# Patient Record
Sex: Male | Born: 1975 | Race: White | Hispanic: No | Marital: Married | Smoking: Never smoker
Health system: Southern US, Community
[De-identification: ages and names within clinical notes are randomized; demographics above are authoritative.]

## PROBLEM LIST (undated history)

## (undated) DIAGNOSIS — N200 Calculus of kidney: Secondary | ICD-10-CM

## (undated) DIAGNOSIS — Y249XXA Unspecified firearm discharge, undetermined intent, initial encounter: Secondary | ICD-10-CM

## (undated) DIAGNOSIS — W3400XA Accidental discharge from unspecified firearms or gun, initial encounter: Secondary | ICD-10-CM

## (undated) DIAGNOSIS — I10 Essential (primary) hypertension: Secondary | ICD-10-CM

## (undated) DIAGNOSIS — E213 Hyperparathyroidism, unspecified: Secondary | ICD-10-CM

## (undated) DIAGNOSIS — F419 Anxiety disorder, unspecified: Secondary | ICD-10-CM

## (undated) HISTORY — DX: Anxiety disorder, unspecified: F41.9

## (undated) HISTORY — DX: Hyperparathyroidism, unspecified: E21.3

## (undated) HISTORY — DX: Accidental discharge from unspecified firearms or gun, initial encounter: W34.00XA

## (undated) HISTORY — DX: Calculus of kidney: N20.0

## (undated) HISTORY — PX: OTHER SURGICAL HISTORY: SHX169

## (undated) HISTORY — DX: Unspecified firearm discharge, undetermined intent, initial encounter: Y24.9XXA

---

## 1999-08-24 ENCOUNTER — Emergency Department (HOSPITAL_COMMUNITY): Admission: EM | Admit: 1999-08-24 | Discharge: 1999-08-24 | Payer: Self-pay | Admitting: *Deleted

## 2001-02-01 ENCOUNTER — Encounter: Payer: Self-pay | Admitting: Urology

## 2001-02-01 ENCOUNTER — Encounter: Admission: RE | Admit: 2001-02-01 | Discharge: 2001-02-01 | Payer: Self-pay | Admitting: Urology

## 2001-05-11 HISTORY — PX: PARATHYROIDECTOMY: SHX19

## 2002-03-09 ENCOUNTER — Ambulatory Visit (HOSPITAL_COMMUNITY): Admission: RE | Admit: 2002-03-09 | Discharge: 2002-03-09 | Payer: Self-pay | Admitting: Surgery

## 2002-03-09 ENCOUNTER — Encounter (INDEPENDENT_AMBULATORY_CARE_PROVIDER_SITE_OTHER): Payer: Self-pay | Admitting: Specialist

## 2008-04-06 ENCOUNTER — Emergency Department (HOSPITAL_BASED_OUTPATIENT_CLINIC_OR_DEPARTMENT_OTHER): Admission: EM | Admit: 2008-04-06 | Discharge: 2008-04-06 | Payer: Self-pay | Admitting: Emergency Medicine

## 2008-07-11 ENCOUNTER — Emergency Department (HOSPITAL_COMMUNITY): Admission: EM | Admit: 2008-07-11 | Discharge: 2008-07-11 | Payer: Self-pay | Admitting: Emergency Medicine

## 2010-05-11 HISTORY — PX: KNEE ARTHROSCOPY: SHX127

## 2010-09-26 NOTE — Op Note (Signed)
Derek Holloway, Derek Holloway                        ACCOUNT NO.:  [id number]   MEDICAL RECORD NO.:  [id number]                   PATIENT TYPE:  OIB   LOCATION:  2891                                 FACILITY:  [facility]   PHYSICIAN:  Abigail Miyamoto, MD                DATE OF BIRTH:  1976/03/09   DATE OF PROCEDURE:  03/09/2002  DATE OF DISCHARGE:                                 OPERATIVE REPORT   PREOPERATIVE DIAGNOSES:  Right inferior parathyroid adenoma.   POSTOPERATIVE DIAGNOSES:  Right inferior parathyroid adenoma.   OPERATION PERFORMED:  Minimally invasive parathyroid resection (MIRP).   SURGEON:  Douglas A. Magnus Ivan, M.D.   ASSISTANT:  Velora Heckler, M.D.   ANESTHESIA:  General endotracheal.   INDICATIONS FOR PROCEDURE:  The patient is a 35 year old gentleman who was  found to have persistently elevated calcium greater than 11.  He also has a  history of kidney stones multiple times.  A sestamibi scan showed findings  consistent with a right inferior parathyroid adenoma.   OPERATIVE FINDINGS:  The patient was found indeed to have a right inferior  parathyroid adenoma on frozen section.   DESCRIPTION OF PROCEDURE:  The patient was brought to the operating room and  identified.  He was placed supine on the operating table and general  anesthesia was induced.  His neck was then prepped and draped in the usual  sterile fashion.  Using a Neoprobe, an area on the right lower neck was  identified.  A small transverse incision was then made over the hot spot.  Incision was carried down to the platysma with the electrocautery.  A  bridging vein was identified and tied off with 2-0 silk ties.  The midline  was then identified and opened and the strap muscles were retracted  laterally.  The thyroid gland was identified and dissection was carried out  bluntly with Kitners elevating the lower pole of the thyroid gland.  Using a  Neoprobe, dissection was carried out further in the neck and  an area of  increased uptake was identified.  Further dissection revealed an enlarged  right inferior parathyroid gland.  Dissection was then carried out  circumferentially both bluntly and with the use of the cautery as well as  small surgical clips.  These vessels and vascular pedicle to the parathyroid  gland were identified and clipped.  What appeared to be the parathyroid  gland was excised from the neck and sent to pathology.  Frozen section found  this to be a large parathyroid gland consistent with an adenoma.  At this  point the neck was thoroughly irrigated with normal saline.  Hemostasis  appeared to be achieved.  A small piece of Surgicel was placed in the small  dissection area.  The strap muscles were then closed in the midline with  interrupted 4-0 Vicryl sutures.  The platysma was then reapproximated with  interrupted 4-0 Vicryl sutures  and the skin was closed with running 4-0  Monocryl.  Steri-  Strips, gauze and tape were then applied.  The patient tolerated the  procedure well.  All sponge, needle and instrument counts were correct at  the end of the procedure.  The patient was then extubated in the operating  room and taken in stable condition to the recovery room.                                                  Abigail Miyamoto, MD    DB/MEDQ  D:  03/09/2002  T:  03/09/2002  Job:  [id number]

## 2010-12-22 ENCOUNTER — Ambulatory Visit (HOSPITAL_BASED_OUTPATIENT_CLINIC_OR_DEPARTMENT_OTHER)
Admission: RE | Admit: 2010-12-22 | Discharge: 2010-12-22 | Disposition: A | Discharge status: Home / Self Care | Payer: 59 | Source: Ambulatory Visit | Attending: Specialist | Admitting: Specialist

## 2010-12-22 DIAGNOSIS — M2240 Chondromalacia patellae, unspecified knee: Secondary | ICD-10-CM | POA: Insufficient documentation

## 2010-12-22 DIAGNOSIS — IMO0002 Reserved for concepts with insufficient information to code with codable children: Secondary | ICD-10-CM | POA: Insufficient documentation

## 2010-12-22 DIAGNOSIS — X58XXXA Exposure to other specified factors, initial encounter: Secondary | ICD-10-CM | POA: Insufficient documentation

## 2010-12-23 LAB — POCT HEMOGLOBIN-HEMACUE: Hemoglobin: 16.2 g/dL (ref 13.0–17.0)

## 2010-12-23 NOTE — Op Note (Signed)
NAMEMarland Kitchen  Derek, Holloway            ACCOUNT NO.:  [id number]  MEDICAL RECORD NO.:  [id number]  LOCATION:                                 FACILITY:  PHYSICIAN:  Jene Every, M.D.    DATE OF BIRTH:  1975-08-03  DATE OF PROCEDURE:  12/22/2010 DATE OF DISCHARGE:                              OPERATIVE REPORT   PREOPERATIVE DIAGNOSES:  Meniscus tear of left knee, degenerative joint disease.  POSTOPERATIVE DIAGNOSES: 1. Grade 3 chondromalacia of the medial femoral condyle, 25 mm x 12     mm. 2. 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  PROCEDURE PERFORMED: 1. Left knee arthroscopy. 2. Shaving a medial meniscus. 3. Chondroplasty of the medial femoral condyle, lateral tibial     plateau, patella with excision of chondral flap tears.  ASSISTANT:  None.  BRIEF HISTORY:  This is a 35 year old gentleman who has a history of chronic knee pain, locking, giving way, and swelling, refractory to conservative treatment including corticosteroid injections and activity modification.  He had an MRI, which indicated meniscal pathology, chondral pathology, lateral sloping patella, history of a knee dislocation.  He had no patellofemoral symptoms, however, he was indicated for arthroscopic evaluation, debridement, and removal of loose bodies.  Risks and benefits discussed including bleeding, infection, no change in symptoms, worsening symptoms, need for repeat debridement, DVT, PE, anesthetic complications, etc.  TECHNIQUE:  The patient in supine position after induction of adequate anesthesia, 2 g Kefzol, left lower extremity was prepped and draped in the usual sterile fashion.  A lateral parapatellar portal was fashioned with a #11 blade.  Ingress cannula was atraumatically placed.  Irrigant was utilized to insufflate the joint.  Under direct visualization, medial parapatellar  portal was fashioned with a #11 blade after localization with an 18 gauge needle sparing the medial meniscus. Noted, immediately was extensive grade 3 chondral injury to the femoral condyle.  Some loose cartilaginous debris was noted.  It was not a grade 4 lesion.  I introduced a basket and then trimmed the edges to a stable base.  Loose cartilaginous debris was evacuated, used a 3.5 [place] shaver for that.  Radial tear in the posterior third of the medial meniscus was debrided to a stable base.  The tibial plateau showed some minor tibial softening.  ACL was unremarkable.  Lateral compartment revealed a deep chondral fissure and lateral tibial plateau, measuring 50 mm x 3 mm.  I trimmed the edges of that to stable base.  He, however, had cartilage at its base was not grade 4 lesion. It was otherwise stable, the overlying femoral condyle as was unremarkable.  The meniscus was stable to probe palpation without evidence of tearing.  Evacuate loose bodies from the lateral compartment 3.5 [place] shaver.  Suprapatellar pouch revealed some lateral tracking of the patella, but at the lateral margin there was no erosion of the patella and the femoral condyle.  In the sulcus, however, there were extensive grade 3 changes of the sulcus.  We excised the edges of the chondral flap tearing to  a stable base, evacuated with 3.5 [place] shaver, light chondroplasty of the patella just of its superomedial aspect.  Knee was copiously lavaged.  Gutters were unremarkable.  Revisited all 3 compartments.  No further pathology amenable to arthroscopic intervention.  I therefore removed all instrumentation.  Portals were closed with 4-0 nylon simple sutures.  0.25% percent Marcaine with epinephrine  was infiltrated in the joint.  The wound was dressed sterilely.  Awoken without difficulty and transported to the recovery room in satisfactory condition.  The patient tolerated the procedure well with no  complications.     Jene Every, M.D.     Derek Holloway  D:  12/22/2010  T:  12/23/2010  Job:  [id number]  Electronically Signed by Jene Every M.D. on 12/23/2010 01:24:48 PM

## 2011-02-10 LAB — BASIC METABOLIC PANEL
BUN: 17
CO2: 27
Chloride: 101
Creatinine, Ser: 1.5
GFR calc Af Amer: >60
Potassium: 3.5

## 2011-02-10 LAB — DIFFERENTIAL
Basophils Relative: 2 — ABNORMAL HIGH
Eosinophils Absolute: 0
Eosinophils Relative: 0
Lymphs Abs: 1.1
Monocytes Absolute: 2 — ABNORMAL HIGH

## 2011-02-10 LAB — CBC
HCT: 47.5
MCHC: 33.4
MCV: 78.7
RBC: 6.04 — ABNORMAL HIGH
WBC: 18.4 — ABNORMAL HIGH

## 2012-12-02 ENCOUNTER — Emergency Department (HOSPITAL_BASED_OUTPATIENT_CLINIC_OR_DEPARTMENT_OTHER)
Admission: EM | Admit: 2012-12-02 | Discharge: 2012-12-02 | Disposition: A | Discharge status: Home / Self Care | Payer: 59 | Attending: Emergency Medicine | Admitting: Emergency Medicine

## 2012-12-02 ENCOUNTER — Encounter (HOSPITAL_BASED_OUTPATIENT_CLINIC_OR_DEPARTMENT_OTHER): Payer: Self-pay | Admitting: *Deleted

## 2012-12-02 DIAGNOSIS — R55 Syncope and collapse: Secondary | ICD-10-CM | POA: Insufficient documentation

## 2012-12-02 DIAGNOSIS — Z8639 Personal history of other endocrine, nutritional and metabolic disease: Secondary | ICD-10-CM | POA: Insufficient documentation

## 2012-12-02 DIAGNOSIS — I10 Essential (primary) hypertension: Secondary | ICD-10-CM | POA: Insufficient documentation

## 2012-12-02 DIAGNOSIS — Z862 Personal history of diseases of the blood and blood-forming organs and certain disorders involving the immune mechanism: Secondary | ICD-10-CM | POA: Insufficient documentation

## 2012-12-02 HISTORY — DX: Essential (primary) hypertension: I10

## 2012-12-02 LAB — CBC WITH DIFFERENTIAL/PLATELET
Basophils Absolute: 0 10*3/uL (ref 0.0–0.1)
Basophils Relative: 1 % (ref 0–1)
Eosinophils Absolute: 0.4 10*3/uL (ref 0.0–0.7)
Eosinophils Relative: 5 % (ref 0–5)
HCT: 42.6 % (ref 39.0–52.0)
Lymphocytes Relative: 26 % (ref 12–46)
MCH: 27.1 pg (ref 26.0–34.0)
MCHC: 34.3 g/dL (ref 30.0–36.0)
MCV: 79 fL (ref 78.0–100.0)
Monocytes Absolute: 0.7 10*3/uL (ref 0.1–1.0)
RDW: 14.1 % (ref 11.5–15.5)

## 2012-12-02 LAB — BASIC METABOLIC PANEL
BUN: 14 mg/dL (ref 6–23)
CO2: 27 mEq/L (ref 19–32)
Calcium: 9.9 mg/dL (ref 8.4–10.5)
Chloride: 99 mEq/L (ref 96–112)
Creatinine, Ser: 1.2 mg/dL (ref 0.50–1.35)
Glucose, Bld: 96 mg/dL (ref 70–99)

## 2012-12-02 NOTE — ED Provider Notes (Signed)
  CSN: [id number]     Arrival date & time 12/02/12  1812 History     First MD Initiated Contact with Patient 12/02/12 2010     Chief Complaint  Patient presents with  . Dizziness   (Consider location/radiation/quality/duration/timing/severity/associated sxs/prior Treatment) HPI This 37 year old male had several minutes of lightheadedness presyncope onset while sitting in a theater tonight, he is able to get up walk out get some water and walk to his car and feel better, was not related to position change, and he has had recurrent symptoms similar to this about once every several weeks over the last few years but never saw Dr. for it, he is no syncope no trauma no headache no change in speech or vision no focal or lateralizing weakness or numbness no chest pain no palpitations no abdominal pain no nausea no sweats no recent bloody stools and no treatment prior to arrival.He is currently symptomatic.he does not have any history of valvular heart disease, coronary artery disease, or heart failure. No history of exertional presyncope/syncope. Past Medical History  Diagnosis Date  . Hypertension   . Thyroid disease    Past Surgical History  Procedure Laterality Date  . Joint replacement     History reviewed. No pertinent family history. History  Substance Use Topics  . Smoking status: Never Smoker   . Smokeless tobacco: Not on file  . Alcohol Use: No    Review of Systems 10 Systems reviewed and are negative for acute change except as noted in the HPI. Allergies  Review of patient's allergies indicates no known allergies.  Home Medications  No current outpatient prescriptions on file. BP 136/86  Pulse 64  Temp(Src) 98.3 F (36.8 C) (Oral)  Resp 14  Ht 5\' 8"  (1.727 m)  Wt 210 lb (95.255 kg)  BMI 31.94 kg/m2  SpO2 99% Physical Exam  Nursing note and vitals reviewed. Constitutional:  Awake, alert, nontoxic appearance.  HENT:  Head: Atraumatic.  Eyes: Right eye exhibits no  discharge. Left eye exhibits no discharge.  Neck: Neck supple.  Cardiovascular: Normal rate and regular rhythm.   No murmur heard. Pulmonary/Chest: Effort normal and breath sounds normal. No respiratory distress. He has no wheezes. He has no rales. He exhibits no tenderness.  Abdominal: Soft. Bowel sounds are normal. He exhibits no distension. There is no tenderness. There is no rebound.  Musculoskeletal: He exhibits no edema and no tenderness.  Baseline ROM, no obvious new focal weakness.  Neurological: He is alert.  Mental status and motor strength appears baseline for patient and situation.  Skin: No rash noted.  Psychiatric: He has a normal mood and affect.    ED Course  ECG: Normal sinus rhythm, ventricular rate 93, normal axis, normal pulse, no acute ischemic changes noted, impression normal ECG, no comparison ECG immediately available  Procedures (including critical care time) Patient / Family / Caregiver informed of clinical course, understand medical decision-making process, and agree with plan. Labs Reviewed  BASIC METABOLIC PANEL - Abnormal; Notable for the following:    Potassium 3.4 (*)    GFR calc non Af Amer 75 (*)    GFR calc Af Amer 87 (*)    All other components within normal limits  GLUCOSE, CAPILLARY  CBC WITH DIFFERENTIAL  TSH   No results found. 1. Near syncope     MDM  I doubt any other EMC precluding discharge at this time including, but not necessarily limited to the following:Vtach.  Hurman Horn, MD 12/03/12 1235

## 2012-12-02 NOTE — ED Notes (Signed)
MD at bedside. 

## 2012-12-02 NOTE — ED Notes (Signed)
Pt c/o sudden onset of dizziness and lightheaded  Lasting 1 minx 20 mins ago

## 2012-12-26 ENCOUNTER — Ambulatory Visit: Payer: 59 | Admitting: Internal Medicine

## 2012-12-30 ENCOUNTER — Encounter: Payer: Self-pay | Admitting: Internal Medicine

## 2012-12-30 ENCOUNTER — Ambulatory Visit (INDEPENDENT_AMBULATORY_CARE_PROVIDER_SITE_OTHER): Payer: 59 | Admitting: Internal Medicine

## 2012-12-30 VITALS — BP 145/100 | HR 96 | Temp 98.1°F | Ht 69.5 in | Wt 218.0 lb

## 2012-12-30 DIAGNOSIS — I10 Essential (primary) hypertension: Secondary | ICD-10-CM

## 2012-12-30 DIAGNOSIS — R42 Dizziness and giddiness: Secondary | ICD-10-CM | POA: Insufficient documentation

## 2012-12-30 DIAGNOSIS — E213 Hyperparathyroidism, unspecified: Secondary | ICD-10-CM

## 2012-12-30 DIAGNOSIS — J309 Allergic rhinitis, unspecified: Secondary | ICD-10-CM

## 2012-12-30 LAB — LIPID PANEL
Cholesterol: 223 mg/dL — ABNORMAL HIGH (ref 0–200)
Total CHOL/HDL Ratio: 5

## 2012-12-30 LAB — LDL CHOLESTEROL, DIRECT: Direct LDL: 163.6 mg/dL

## 2012-12-30 LAB — BASIC METABOLIC PANEL
BUN: 17 mg/dL (ref 6–23)
Calcium: 9.7 mg/dL (ref 8.4–10.5)
Chloride: 105 mEq/L (ref 96–112)
Creatinine, Ser: 1.3 mg/dL (ref 0.4–1.5)

## 2012-12-30 MED ORDER — AMLODIPINE BESYLATE 5 MG PO TABS: 5.0000 mg | ORAL_TABLET | Freq: Every day | ORAL | Status: DC

## 2012-12-30 MED ORDER — FLUTICASONE PROPIONATE 50 MCG/ACT NA SUSP: 2.0000 | Freq: Every day | NASAL | Status: DC

## 2012-12-30 NOTE — Assessment & Plan Note (Addendum)
Mildly elevated BP from baseline of 116/60 in the setting of weight gain. Slightly elevated BP could be from a change in lifestyle or the beginning of primary hypertension. Plan: Recheck a BMP, screening for high cholesterol Diet, exercise discussed, the patient states he will improve his lifestyle. Start amlodipine, watch for edema, see instructions

## 2012-12-30 NOTE — Progress Notes (Signed)
  Subjective:    Patient ID: Derek Holloway, male    DOB: May 21, 1975, 37 y.o.   MRN: [id number]  HPI Patient, We discussed the following issues. Elevated BP, baseline  BP for years was 116/60, for the last one half years his BP has been 130/86. Today it is 145/100. Reports 20 pound weight gain in the last 2 years, not exercising as much as he did before. Allergies-denies several years history of allergies, has taken different antihistaminics on and off, he used to have a nasal sprays but worked well. Likes to be tested further by an allergist "Dizziness"-- Few months ago was at the movies and all of a sudden felt really weird, "out of my head, out of my body", "like i just wake up " Symptoms lasted 5 seconds, subsequently he went to the ER, labs and EKG reviewed, they were essentially normal.  Past Medical History  Diagnosis Date  . Hypertension   . Hyperparathyroidism, unspecified     s/p surgery  . Kidney stones    Past Surgical History  Procedure Laterality Date  . Knee arthroscopy Left 2012  . Parathyroidectomy  2003   History   Social History  . Marital Status: Married    Spouse Name: N/A    Number of Children: 2  . Years of Education: N/A   Occupational History  . Police Officer [employer]   Social History Main Topics  . Smoking status: Never Smoker   . Smokeless tobacco: Never Used  . Alcohol Use: Yes     Comment: rare  . Drug Use: No  . Sexual Activity: No   Other Topics Concern  . Not on file   Social History Narrative  . No narrative on file   Family History  Problem Relation Age of Onset  . CAD      GM in her 33s  . Stroke Neg Hx   . Hypertension Mother   . Diabetes Neg Hx   . Colon cancer Neg Hx   . Breast cancer Neg Hx      Review of Systems And the episodes as described above he did not experience actual vertigo, slurred speech, mental status changes, focal deficit, face numbness, or diplopia. No chest pain or palpitations No  HAs No seizure activities.     Objective:   Physical Exam BP 145/100  Pulse 96  Temp(Src) 98.1 F (36.7 C)  Ht 5' 9.5" (1.765 m)  Wt 218 lb (98.884 kg)  BMI 31.74 kg/m2  SpO2 98% General -- alert, well-developed, NAD.  Neck --no thyromegaly  HEENT-- Not pale.  Lungs -- normal respiratory effort, no intercostal retractions, no accessory muscle use, and normal breath sounds.  Heart-- normal rate, regular rhythm, no murmur.  Abdomen-- Not distended, good bowel sounds,soft, non-tender. No bruit   Extremities-- no pretibial edema bilaterally  Neurologic-- alert & oriented X3. Speech, gait normal. strength normal in all extremities.  Psych-- Cognition and judgment appear intact. Alert and cooperative with normal attention span and concentration. not anxious appearing and not depressed appearing.      Assessment & Plan:  Today , I spent more than 30 min with the patient, >50% of the time counseling, and /or reviewing the chart and labs ordered by other providers

## 2012-12-30 NOTE — Assessment & Plan Note (Addendum)
Several year history of allergies, continue with OTC antihistaminics, add Flonase and refer to an allergist as patient likes further testing

## 2012-12-30 NOTE — Assessment & Plan Note (Signed)
Spells as described above unlikely to be cardiac, absent seizure? Recommend observation. Consider neurology eval if sx continue

## 2012-12-30 NOTE — Patient Instructions (Addendum)
Get your blood work before you leave  Next visit in 4 months for a physical exam   Please make an appointment before you leave the office today (or call few weeks in advance) --- Check the  blood pressure 2 or 3 times a week, be sure it is between 110/60 and 135/80. If it is consistently higher or lower, let me know ---   Sodium-Controlled Diet Sodium is a mineral. It is found in many foods. Sodium may be found naturally or added during the making of a food. The most common form of sodium is salt, which is made up of sodium and chloride. Reducing your sodium intake involves changing your eating habits. The following guidelines will help you reduce the sodium in your diet:  Stop using the salt shaker.  Use salt sparingly in cooking and baking.  Substitute with sodium-free seasonings and spices.  Do not use a salt substitute (potassium chloride) without your caregiver's permission.  Include a variety of fresh, unprocessed foods in your diet.  Limit the use of processed and convenience foods that are high in sodium. USE THE FOLLOWING FOODS SPARINGLY: Breads/Starches  Commercial bread stuffing, commercial pancake or waffle mixes, coating mixes. Waffles. Croutons. Prepared (boxed or frozen) potato, rice, or noodle mixes that contain salt or sodium. Salted [place] fries or hash browns. Salted popcorn, breads, crackers, chips, or snack foods. Vegetables  Vegetables canned with salt or prepared in cream, butter, or cheese sauces. Sauerkraut. Tomato or vegetable juices canned with salt.  Fresh vegetables are allowed if rinsed thoroughly. Fruit  Fruit is okay to eat. Meat and Meat Substitutes  Salted or smoked meats, such as bacon or Canadian bacon, chipped or corned beef, hot dogs, salt pork, luncheon meats, pastrami, ham, or sausage. Canned or smoked fish, poultry, or meat. Processed cheese or cheese spreads, blue or Roquef[place] cheese. Battered or frozen fish products. Prepared spaghetti  sauce. Baked beans. Reuben sandwiches. Salted nuts. Caviar. Milk  Limit buttermilk to 1 cup per week. Soups and Combination Foods  Bouillon cubes, canned or dried soups, broth, consomm. Convenience (frozen or packaged) dinners with more than 600 mg sodium. Pot pies, pizza, Asian food, fast food cheeseburgers, and specialty sandwiches. Desserts and Sweets  Regular (salted) desserts, pie, commercial fruit snack pies, commercial snack cakes, canned puddings.  Eat desserts and sweets in moderation. Fats and Oils  Gravy mixes or canned gravy. No more than 1 to 2 tbs of salad dressing. Chip dips.  Eat fats and oils in moderation. Beverages  See those listed under the vegetables and milk groups. Condiments  Ketchup, mustard, meat sauces, salsa, regular (salted) and lite soy sauce or mustard. Dill pickles, olives, meat tenderizer. Prepared horseradish or pickle relish. Dutch-processed cocoa. Baking powder or baking soda used medicinally. Worcestershire sauce. "Light" salt. Salt substitute, unless approved by your caregiver. Document Released: 10/17/2001 Document Revised: 07/20/2011 Document Reviewed: 05/20/2009 [facility] Patient Information 2014 [place], [place].

## 2013-01-01 ENCOUNTER — Encounter: Payer: Self-pay | Admitting: Internal Medicine

## 2013-01-01 NOTE — Assessment & Plan Note (Signed)
Labs

## 2013-03-16 ENCOUNTER — Other Ambulatory Visit: Payer: Self-pay

## 2013-04-17 ENCOUNTER — Encounter: Payer: 59 | Admitting: Internal Medicine

## 2013-08-28 ENCOUNTER — Other Ambulatory Visit: Payer: Self-pay | Admitting: Internal Medicine

## 2013-10-30 ENCOUNTER — Telehealth: Payer: Self-pay | Admitting: *Deleted

## 2013-10-30 NOTE — Telephone Encounter (Signed)
Patient called to return Sandra's phone call. Patient states that you can call his cell phone to get in touch with him. Cell number:[phone]

## 2013-10-30 NOTE — Telephone Encounter (Signed)
Left message with pts wife for the patient to return my call regarding upcoming appt tomorrow.

## 2013-10-30 NOTE — Telephone Encounter (Signed)
Medication List and allergies:  Reviewed and updated   Local prescriptions: [facility] in [place]  Immunizations Due: Pna/Td  A/P FH, PSH, or Personal Hx: Reviewed and updated Flu vaccine: 03/2013 Tdap: 04/1993 PNA: Never   To Discuss with Provider: Seasonal allergies are no longer controlled with Allegra alone.

## 2013-10-31 ENCOUNTER — Encounter: Payer: Self-pay | Admitting: Internal Medicine

## 2013-10-31 ENCOUNTER — Ambulatory Visit (INDEPENDENT_AMBULATORY_CARE_PROVIDER_SITE_OTHER): Payer: 59 | Admitting: Internal Medicine

## 2013-10-31 VITALS — BP 130/89 | HR 93 | Temp 97.9°F | Ht 67.0 in | Wt 197.0 lb

## 2013-10-31 DIAGNOSIS — Z0000 Encounter for general adult medical examination without abnormal findings: Secondary | ICD-10-CM | POA: Insufficient documentation

## 2013-10-31 DIAGNOSIS — I10 Essential (primary) hypertension: Secondary | ICD-10-CM

## 2013-10-31 DIAGNOSIS — Z23 Encounter for immunization: Secondary | ICD-10-CM

## 2013-10-31 DIAGNOSIS — J301 Allergic rhinitis due to pollen: Secondary | ICD-10-CM

## 2013-10-31 LAB — LIPID PANEL
Cholesterol: 237 mg/dL — ABNORMAL HIGH (ref 0–200)
HDL: 57.1 mg/dL (ref >39.00)
LDL Cholesterol: 162 mg/dL — ABNORMAL HIGH (ref 0–99)
NonHDL: 179.9
TRIGLYCERIDES: 90 mg/dL (ref 0.0–149.0)
Total CHOL/HDL Ratio: 4
VLDL: 18 mg/dL (ref 0.0–40.0)

## 2013-10-31 LAB — ALT: ALT: 20 U/L (ref 0–53)

## 2013-10-31 LAB — BASIC METABOLIC PANEL
BUN: 16 mg/dL (ref 6–23)
CALCIUM: 9.8 mg/dL (ref 8.4–10.5)
CO2: 31 meq/L (ref 19–32)
CREATININE: 1.3 mg/dL (ref 0.4–1.5)
Chloride: 102 mEq/L (ref 96–112)
GFR: 64.01 mL/min (ref >60.00)
GLUCOSE: 96 mg/dL (ref 70–99)
Potassium: 3.5 mEq/L (ref 3.5–5.1)
SODIUM: 140 meq/L (ref 135–145)

## 2013-10-31 LAB — AST: AST: 24 U/L (ref 0–37)

## 2013-10-31 MED ORDER — MONTELUKAST SODIUM 10 MG PO TABS
10.0000 mg | ORAL_TABLET | Freq: Every day | ORAL | Status: DC
Start: 2013-10-31 — End: 2014-05-03

## 2013-10-31 NOTE — Assessment & Plan Note (Addendum)
Td-- 1994 and today Continue healthy lifestyle Previous labs reviewed, will check FLP, BMP and LFTs

## 2013-10-31 NOTE — Assessment & Plan Note (Addendum)
Patient improve his lifestyle and he started to lose weight, then started to feel weak and he suspected his BP was better thus d/c amlodipine and feels great.  BP today wnl Plan: Continue off medications, monitor BPs. He is doing great

## 2013-10-31 NOTE — Progress Notes (Signed)
   Subjective:    Patient ID: Derek Holloway, male    DOB: 1975/11/20, 38 y.o.   MRN: [id number]  DOS:  10/31/2013 Type of  Visit: CPX History: In general feeling great, is eating healthier and keeps himself active, has lost 20 pounds. Continue taking Allegra and Flonase, despite therapy allergies are still issue, on-off. Occasionally feels some dryness in the anterior chest, tracheal area, thinks related to allergies . No chest pain per se, no associated cough or wheezing   ROS Denies chest pain, difficulty breathing or lower extremity edema No  nausea, vomiting, diarrhea or blood in the stools. No anxiety, depression. No dysuria or  gross hematuria  Past Medical History  Diagnosis Date  . Hypertension   . Hyperparathyroidism, unspecified     s/p surgery  . Kidney stones     Past Surgical History  Procedure Laterality Date  . Knee arthroscopy Left 2012  . Parathyroidectomy  2003    History   Social History  . Marital Status: Married    Spouse Name: N/A    Number of Children: 2  . Years of Education: N/A   Occupational History  . Engineer, structural Unemployed   Social History Main Topics  . Smoking status: Never Smoker   . Smokeless tobacco: Never Used  . Alcohol Use: Yes     Comment: rare  . Drug Use: No  . Sexual Activity: No   Other Topics Concern  . Not on file   Social History Narrative  . No narrative on file     Family History  Problem Relation Age of Onset  . CAD      GM in her 31s  . Stroke Neg Hx   . Hypertension Mother   . Diabetes Neg Hx   . Colon cancer Neg Hx   . Breast cancer Neg Hx        Medication List       This list is accurate as of: 10/31/13  5:02 PM.  Always use your most recent med list.               ALLEGRA PO  Take by mouth.     fluticasone 50 MCG/ACT nasal spray  Commonly known as:  FLONASE  Place 2 sprays into the nose daily.     montelukast 10 MG tablet  Commonly known as:  SINGULAIR  Take 1 tablet  (10 mg total) by mouth at bedtime.           Objective:   Physical Exam BP 130/89  Pulse 93  Temp(Src) 97.9 F (36.6 C)  Ht 5\' 7"  (1.702 m)  Wt 197 lb (89.359 kg)  BMI 30.85 kg/m2  SpO2 99%  General -- alert, well-developed, NAD.  Neck --no thyromegaly  HEENT-- Not pale. Lungs -- normal respiratory effort, no intercostal retractions, no accessory muscle use, and normal breath sounds.  Heart-- normal rate, regular rhythm, no murmur.  Abdomen-- Not distended, good bowel sounds,soft, non-tender. Extremities-- no pretibial edema bilaterally  Neurologic--  alert & oriented X3. Speech normal, gait appropriate for age, strength symmetric and appropriate for age.  Psych-- Cognition and judgment appear intact. Cooperative with normal attention span and concentration. No anxious or depressed appearing.      Assessment & Plan:

## 2013-10-31 NOTE — Progress Notes (Signed)
Pre visit review using our clinic review tool, if applicable. No additional management support is needed unless otherwise documented below in the visit note. 

## 2013-10-31 NOTE — Assessment & Plan Note (Signed)
Takes Allegra and Flonase regularly, still has some symptoms. Add singular

## 2013-10-31 NOTE — Patient Instructions (Signed)
Get your blood work before you leave   I sent a prescription for Singulair for allergies  Check the  blood pressure monthly  be sure it is between 110/60 and 140/85. Ideal blood pressure is 120/80. If it is consistently higher or lower, let me know  Next visit is for a physical exam in 1 year,  fasting Please make an appointment

## 2013-12-30 ENCOUNTER — Emergency Department (HOSPITAL_BASED_OUTPATIENT_CLINIC_OR_DEPARTMENT_OTHER): Payer: 59

## 2013-12-30 ENCOUNTER — Encounter (HOSPITAL_BASED_OUTPATIENT_CLINIC_OR_DEPARTMENT_OTHER): Payer: Self-pay | Admitting: Emergency Medicine

## 2013-12-30 ENCOUNTER — Emergency Department (HOSPITAL_BASED_OUTPATIENT_CLINIC_OR_DEPARTMENT_OTHER)
Admission: EM | Admit: 2013-12-30 | Discharge: 2013-12-30 | Disposition: A | Discharge status: Home / Self Care | Payer: 59 | Attending: Emergency Medicine | Admitting: Emergency Medicine

## 2013-12-30 DIAGNOSIS — Z862 Personal history of diseases of the blood and blood-forming organs and certain disorders involving the immune mechanism: Secondary | ICD-10-CM | POA: Diagnosis not present

## 2013-12-30 DIAGNOSIS — I10 Essential (primary) hypertension: Secondary | ICD-10-CM | POA: Diagnosis not present

## 2013-12-30 DIAGNOSIS — R42 Dizziness and giddiness: Secondary | ICD-10-CM | POA: Diagnosis not present

## 2013-12-30 DIAGNOSIS — Z8639 Personal history of other endocrine, nutritional and metabolic disease: Secondary | ICD-10-CM | POA: Insufficient documentation

## 2013-12-30 DIAGNOSIS — IMO0002 Reserved for concepts with insufficient information to code with codable children: Secondary | ICD-10-CM | POA: Diagnosis not present

## 2013-12-30 DIAGNOSIS — Z87442 Personal history of urinary calculi: Secondary | ICD-10-CM | POA: Diagnosis not present

## 2013-12-30 DIAGNOSIS — R079 Chest pain, unspecified: Secondary | ICD-10-CM

## 2013-12-30 DIAGNOSIS — Z79899 Other long term (current) drug therapy: Secondary | ICD-10-CM | POA: Diagnosis not present

## 2013-12-30 LAB — BASIC METABOLIC PANEL
Anion gap: 11 (ref 5–15)
BUN: 18 mg/dL (ref 6–23)
CALCIUM: 10.7 mg/dL — AB (ref 8.4–10.5)
CO2: 28 meq/L (ref 19–32)
CREATININE: 1.3 mg/dL (ref 0.50–1.35)
Chloride: 103 mEq/L (ref 96–112)
GFR calc Af Amer: 79 mL/min — ABNORMAL LOW (ref >90)
GFR calc non Af Amer: 68 mL/min — ABNORMAL LOW (ref >90)
GLUCOSE: 116 mg/dL — AB (ref 70–99)
Potassium: 4 mEq/L (ref 3.7–5.3)
Sodium: 142 mEq/L (ref 137–147)

## 2013-12-30 LAB — CBC
HCT: 43.3 % (ref 39.0–52.0)
HEMOGLOBIN: 14.6 g/dL (ref 13.0–17.0)
MCH: 27.1 pg (ref 26.0–34.0)
MCHC: 33.7 g/dL (ref 30.0–36.0)
MCV: 80.5 fL (ref 78.0–100.0)
Platelets: 222 10*3/uL (ref 150–400)
RBC: 5.38 MIL/uL (ref 4.22–5.81)
RDW: 14.1 % (ref 11.5–15.5)
WBC: 6.9 10*3/uL (ref 4.0–10.5)

## 2013-12-30 LAB — TROPONIN I: Troponin I: <0.3 ng/mL (ref <0.30)

## 2013-12-30 NOTE — ED Notes (Signed)
Reports tightness in the chest. Also sts dizziness and vertigo spells as well. Sts it comes and goes at different times.

## 2013-12-30 NOTE — Discharge Instructions (Signed)

## 2013-12-30 NOTE — ED Provider Notes (Signed)
CSN: [id number]     Arrival date & time 12/30/13  1614 History   First MD Initiated Contact with Patient 12/30/13 1752     Chief Complaint  Patient presents with  . Chest Pain  . Dizziness     (Consider location/radiation/quality/duration/timing/severity/associated sxs/prior Treatment) Patient is a 38 y.o. male presenting with chest pain and dizziness. The history is provided by the patient. No language interpreter was used.  Chest Pain Pain location:  L chest Associated symptoms: dizziness   Associated symptoms: no fever and no shortness of breath   Associated symptoms comment:  The patient reports chronic symptoms of chest discomfort, and sometimes with dizziness that is short lived. The pain in his chest was the worst today than it had ever been. No cough, fever. No SOB. He is able to perform his usual activities without worsening pain, difficulty breathing or fatigue. Symptoms have been present to some degree for "months". Dizziness Associated symptoms: chest pain   Associated symptoms: no shortness of breath     Past Medical History  Diagnosis Date  . Hypertension   . Hyperparathyroidism, unspecified     s/p surgery  . Kidney stones    Past Surgical History  Procedure Laterality Date  . Knee arthroscopy Left 2012  . Parathyroidectomy  2003   Family History  Problem Relation Age of Onset  . CAD      GM in her 32s  . Stroke Neg Hx   . Hypertension Mother   . Diabetes Neg Hx   . Colon cancer Neg Hx   . Breast cancer Neg Hx    History  Substance Use Topics  . Smoking status: Never Smoker   . Smokeless tobacco: Never Used  . Alcohol Use: Yes     Comment: rare    Review of Systems  Constitutional: Negative for fever and chills.  HENT: Negative.   Respiratory: Negative.  Negative for shortness of breath.   Cardiovascular: Positive for chest pain.  Gastrointestinal: Negative.   Musculoskeletal: Negative.   Skin: Negative.   Neurological: Positive for  dizziness.      Allergies  Review of patient's allergies indicates no known allergies.  Home Medications   Prior to Admission medications   Medication Sig Start Date End Date Taking? Authorizing Provider  Fexofenadine HCl (ALLEGRA PO) Take by mouth.    Historical Provider, MD  fluticasone (FLONASE) 50 MCG/ACT nasal spray Place 2 sprays into the nose daily. 12/30/12   Colon Branch, MD  montelukast (SINGULAIR) 10 MG tablet Take 1 tablet (10 mg total) by mouth at bedtime. 10/31/13   Colon Branch, MD   BP 144/91  Pulse 82  Temp(Src) 98.3 F (36.8 C) (Oral)  Resp 20  Ht 5\' 8"  (1.727 m)  Wt 197 lb (89.359 kg)  BMI 29.96 kg/m2  SpO2 98% Physical Exam  Constitutional: He is oriented to person, place, and time. He appears well-developed and well-nourished.  HENT:  Head: Normocephalic.  Neck: Normal range of motion. Neck supple.  Cardiovascular: Normal rate and regular rhythm.   Pulmonary/Chest: Effort normal and breath sounds normal. He has no wheezes. He has no rales. He exhibits no tenderness.  Abdominal: Soft. Bowel sounds are normal. There is no tenderness. There is no rebound and no guarding.  Musculoskeletal: Normal range of motion.  Neurological: He is alert and oriented to person, place, and time.  Skin: Skin is warm and dry. No rash noted.  Psychiatric: He has a normal mood and affect.  ED Course  Procedures (including critical care time) Labs Review Labs Reviewed  BASIC METABOLIC PANEL - Abnormal; Notable for the following:    Glucose, Bld 116 (*)    Calcium 10.7 (*)    GFR calc non Af Amer 68 (*)    GFR calc Af Amer 79 (*)    All other components within normal limits  TROPONIN I  CBC    Imaging Review Dg Chest 2 View  12/30/2013   CLINICAL DATA:  Chest discomfort  EXAM: CHEST  2 VIEW  COMPARISON:  None.  FINDINGS: Lungs are clear. Heart size and pulmonary vascularity are normal. No adenopathy. No pneumothorax. No bone lesions.  IMPRESSION: No abnormality noted.    Electronically Signed   By: Lowella Grip M.D.   On: 12/30/2013 18:49     EKG Interpretation   Date/Time:  Saturday December 30 2013 16:21:00 EDT Ventricular Rate:  85 PR Interval:  126 QRS Duration: 98 QT Interval:  372 QTC Calculation: 442 R Axis:   58 Text Interpretation:  Normal sinus rhythm Normal ECG Sinus rhythm Normal  ECG Confirmed by Carmin Muskrat  MD ([id number]) on 12/30/2013 4:51:48 PM      MDM   Final diagnoses:  None    1. Nonspecific chest pain  Pain in chest is atypical for ACS with during of months. Negative evaluation here. He appears well, non-toxic. Able to be discharged home and encouraged to follow up with his doctor for persistent symptoms.    Dewaine Oats, PA-C 12/30/13 1908

## 2013-12-31 NOTE — ED Provider Notes (Signed)
  Medical screening examination/treatment/procedure(s) were performed by non-physician practitioner and as supervising physician I was immediately available for consultation/collaboration.   EKG Interpretation   Date/Time:  Saturday December 30 2013 16:21:00 EDT Ventricular Rate:  85 PR Interval:  126 QRS Duration: 98 QT Interval:  372 QTC Calculation: 442 R Axis:   58 Text Interpretation:  Normal sinus rhythm Normal ECG Sinus rhythm Normal  ECG Confirmed by Carmin Muskrat  MD ([id number]) on 12/30/2013 4:51:48 PM         Carmin Muskrat, MD 12/31/13 1810

## 2014-05-03 ENCOUNTER — Encounter (HOSPITAL_BASED_OUTPATIENT_CLINIC_OR_DEPARTMENT_OTHER): Payer: Self-pay

## 2014-05-03 ENCOUNTER — Emergency Department (HOSPITAL_BASED_OUTPATIENT_CLINIC_OR_DEPARTMENT_OTHER): Payer: 59

## 2014-05-03 ENCOUNTER — Emergency Department (HOSPITAL_BASED_OUTPATIENT_CLINIC_OR_DEPARTMENT_OTHER)
Admission: EM | Admit: 2014-05-03 | Discharge: 2014-05-03 | Disposition: A | Discharge status: Home / Self Care | Payer: 59 | Attending: Emergency Medicine | Admitting: Emergency Medicine

## 2014-05-03 DIAGNOSIS — Z87442 Personal history of urinary calculi: Secondary | ICD-10-CM | POA: Insufficient documentation

## 2014-05-03 DIAGNOSIS — I10 Essential (primary) hypertension: Secondary | ICD-10-CM | POA: Diagnosis not present

## 2014-05-03 DIAGNOSIS — R002 Palpitations: Secondary | ICD-10-CM | POA: Diagnosis not present

## 2014-05-03 DIAGNOSIS — Z8639 Personal history of other endocrine, nutritional and metabolic disease: Secondary | ICD-10-CM | POA: Diagnosis not present

## 2014-05-03 DIAGNOSIS — Z79899 Other long term (current) drug therapy: Secondary | ICD-10-CM | POA: Insufficient documentation

## 2014-05-03 LAB — BASIC METABOLIC PANEL
Anion gap: 9 (ref 5–15)
BUN: 15 mg/dL (ref 6–23)
CALCIUM: 9.9 mg/dL (ref 8.4–10.5)
CHLORIDE: 103 meq/L (ref 96–112)
CO2: 28 mmol/L (ref 19–32)
CREATININE: 1.17 mg/dL (ref 0.50–1.35)
GFR calc Af Amer: 89 mL/min — ABNORMAL LOW (ref >90)
GFR calc non Af Amer: 77 mL/min — ABNORMAL LOW (ref >90)
GLUCOSE: 102 mg/dL — AB (ref 70–99)
Potassium: 4 mmol/L (ref 3.5–5.1)
Sodium: 140 mmol/L (ref 135–145)

## 2014-05-03 LAB — CBC
HEMATOCRIT: 45.1 % (ref 39.0–52.0)
Hemoglobin: 15.4 g/dL (ref 13.0–17.0)
MCH: 27.2 pg (ref 26.0–34.0)
MCHC: 34.1 g/dL (ref 30.0–36.0)
MCV: 79.5 fL (ref 78.0–100.0)
PLATELETS: 231 10*3/uL (ref 150–400)
RBC: 5.67 MIL/uL (ref 4.22–5.81)
RDW: 14.1 % (ref 11.5–15.5)
WBC: 8.6 10*3/uL (ref 4.0–10.5)

## 2014-05-03 LAB — TROPONIN I: Troponin I: <0.03 ng/mL (ref <0.031)

## 2014-05-03 NOTE — ED Notes (Addendum)
C/o intermittent palpitations since 4pm-denies pain-reports hx of same x 4-seen here for same-had PCP follow up-no Cardio follow up per PCP advisement

## 2014-05-03 NOTE — ED Provider Notes (Signed)
CSN: [id number]     Arrival date & time 05/03/14  1958 History  This chart was scribed for No att. providers found by Randa Evens, ED Scribe. This patient was seen in room MHOTF/OTF and the patient's care was started at 6:34 PM.      Chief Complaint  Patient presents with  . Palpitations    Patient is a 38 y.o. male presenting with palpitations. The history is provided by the patient. No language interpreter was used.  Palpitations Palpitations quality:  Fast Onset quality:  At rest Duration:  6 hours Timing:  Intermittent Progression:  Resolved Chronicity:  Recurrent Context: anxiety and caffeine   Worsened by:  Nothing tried Associated symptoms: no chest pain and no chest pressure    HPI Comments: TYLEK BONEY is a 38 y.o. male who presents to the Emergency Department complaining of resolved intermittent heart palpitation onset 4 PM today. Pt states that that his symptoms began while lying down resting. Pt states that he doesn't know what his stressors are to cause him to feel the palpitations. Pt states that today he did have about 2 cups of coffee back to back. He states that he has a Hx of similar symptoms about 1 year prior. Pt states that he may have anxiety.  Pt states that he took a 5mg  amlodipine today. Pt denies chest pain, chest pressure, dehydration. Pt denies fever or leg swelling.  Pt states that he is on amoxicillin for sinus infection.   Past Medical History  Diagnosis Date  . Hypertension   . Hyperparathyroidism, unspecified     s/p surgery  . Kidney stones    Past Surgical History  Procedure Laterality Date  . Knee arthroscopy Left 2012  . Parathyroidectomy  2003   Family History  Problem Relation Age of Onset  . CAD      GM in her 76s  . Stroke Neg Hx   . Hypertension Mother   . Diabetes Neg Hx   . Colon cancer Neg Hx   . Breast cancer Neg Hx    History  Substance Use Topics  . Smoking status: Never Smoker   . Smokeless tobacco: Never  Used  . Alcohol Use: Yes    Review of Systems  Constitutional: Negative for fever.  Cardiovascular: Positive for palpitations. Negative for chest pain and leg swelling.  All other systems reviewed and are negative.   Allergies  Review of patient's allergies indicates no known allergies.  Home Medications   Prior to Admission medications   Medication Sig Start Date End Date Taking? Authorizing Provider  AMLODIPINE BESYLATE PO Take by mouth.   Yes Historical Provider, MD  Amoxicillin (AMOXIL PO) Take by mouth.   Yes Historical Provider, MD  Cetirizine HCl (ZYRTEC PO) Take by mouth.   Yes Historical Provider, MD   Triage Vitals: BP 128/89 mmHg  Pulse 89  Temp(Src) 98.4 F (36.9 C) (Oral)  Resp 16  Ht 5\' 8"  (1.727 m)  Wt 200 lb (90.719 kg)  BMI 30.42 kg/m2  SpO2 98%  Physical Exam  Constitutional: He is oriented to person, place, and time. He appears well-developed and well-nourished. No distress.  HENT:  Head: Normocephalic and atraumatic.  Eyes: Conjunctivae and EOM are normal.  Neck: Neck supple. No tracheal deviation present.  Cardiovascular: Normal rate.   Pulmonary/Chest: Effort normal. No respiratory distress.  Musculoskeletal: Normal range of motion.  Neurological: He is alert and oriented to person, place, and time.  Skin: Skin is warm and  dry.  Psychiatric: He has a normal mood and affect. His behavior is normal.  Nursing note and vitals reviewed. note- Chest- lungs CTAB, no wheezes rales or rhonchi, CV- RRR, no murmur, Extremities- no peripheral edema  ED Course  Procedures (including critical care time) DIAGNOSTIC STUDIES: Oxygen Saturation is 98% on RA, normal by my interpretation.    COORDINATION OF CARE: 10:15 PM-Discussed treatment plan with pt at bedside and pt agreed to plan.     Labs Review Labs Reviewed  BASIC METABOLIC PANEL - Abnormal; Notable for the following:    Glucose, Bld 102 (*)    GFR calc non Af Amer 77 (*)    GFR calc Af Amer 89  (*)    All other components within normal limits  CBC  TROPONIN I    Imaging Review Dg Chest 2 View  05/03/2014   CLINICAL DATA:  Palpitations and tachycardia beginning at 1600 hr, has happened on 4 other occasions, history hypertension  EXAM: CHEST  2 VIEW  COMPARISON:  12/30/2013  FINDINGS: Normal heart size, mediastinal contours, and pulmonary vascularity.  Lungs clear.  No pleural effusion or pneumothorax.  Surgical clips at RIGHT supraclavicular region medially, by history prior parathyroidectomy.  IMPRESSION: No acute abnormalities.   Electronically Signed   By: Lavonia Dana M.D.   On: 05/03/2014 21:43     EKG Interpretation   Date/Time:  Thursday May 03 2014 20:11:19 EST Ventricular Rate:  95 PR Interval:  130 QRS Duration: 90 QT Interval:  368 QTC Calculation: 462 R Axis:   67 Text Interpretation:  Normal sinus rhythm Normal ECG No significant change  since last tracing Confirmed by Canary Brim  MD, Jazmina Muhlenkamp [phone]) on 05/03/2014  8:14:41 PM      MDM   Final diagnoses:  Palpitations    Pt presenting with c/o sensation of palpitations.  Pt states that once he had EKG done in triage he felt much less anxious about his symptoms and symptoms resolved.  Labs and EKG and CXR are reassuring.  Encouraged pt to arrange for f/u with cardiology for holter monitor as symptoms are recurring for him.  No chest pain or syncope.  Denies caffeine or other substances that might contribute to his symptoms.   Discharged with strict return precautions.  Pt agreeable with plan.  I personally performed the services described in this documentation, which was scribed in my presence. The recorded information has been reviewed and is accurate.      Threasa Beards, MD 05/04/14 [phone]

## 2014-05-03 NOTE — Discharge Instructions (Signed)
Return to the ED with any concerns including chest pain, difficulty breathing, fainting, decreased level of alertness/lethargy, or any other alarming symptoms

## 2014-05-25 ENCOUNTER — Ambulatory Visit (INDEPENDENT_AMBULATORY_CARE_PROVIDER_SITE_OTHER): Payer: 59 | Admitting: Internal Medicine

## 2014-05-25 ENCOUNTER — Encounter: Payer: Self-pay | Admitting: Internal Medicine

## 2014-05-25 VITALS — BP 150/90 | HR 89 | Temp 98.0°F | Wt 211.1 lb

## 2014-05-25 DIAGNOSIS — F419 Anxiety disorder, unspecified: Secondary | ICD-10-CM

## 2014-05-25 DIAGNOSIS — J0100 Acute maxillary sinusitis, unspecified: Secondary | ICD-10-CM

## 2014-05-25 MED ORDER — AZITHROMYCIN 250 MG PO TABS: ORAL_TABLET | ORAL | Status: DC

## 2014-05-25 MED ORDER — ALPRAZOLAM 0.5 MG PO TABS: 0.5000 mg | ORAL_TABLET | Freq: Every day | ORAL | Status: DC | PRN

## 2014-05-25 NOTE — Progress Notes (Signed)
   Subjective:    Patient ID: Derek Holloway, male    DOB: 12-Sep-1975, 39 y.o.   MRN: [id number]  DOS:  05/25/2014 Type of visit - description : Acute Interval history: Having on and off cough, sinus pain, congestion, nasal discharge. Went to urgent care approximately December 19, was prescribed amoxicillin, symptoms temporarily improved but they are coming back.  Also, has on and off severe palpitations, the last episode happened a few days ago, went to the ER, records reviewed, workup negative. Was recommended to see cardiology for further eval. 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    ROS Denies nausea, vomiting, diarrhea. No sputum production, chest pain or difficulty breathing   Past Medical History  Diagnosis Date  . Hypertension   . Hyperparathyroidism, unspecified     s/p surgery  . Kidney stones     Past Surgical History  Procedure Laterality Date  . Knee arthroscopy Left 2012  . Parathyroidectomy  2003    History   Social History  . Marital Status: Married    Spouse Name: N/A    Number of Children: 2  . Years of Education: N/A   Occupational History  . Engineer, structural    Social History Main Topics  . Smoking status: Never Smoker   . Smokeless tobacco: Never Used  . Alcohol Use: Yes  . Drug Use: No  . Sexual Activity: Not on file   Other Topics Concern  . Not on file   Social History Narrative        Medication List       This list is accurate as of: 05/25/14 11:59 PM.  Always use your most recent med list.               ALPRAZolam 0.5 MG tablet  Commonly known as:  XANAX  Take 1-2 tablets (0.5-1 mg total) by mouth daily as needed for anxiety.     AMLODIPINE BESYLATE PO  Take by mouth.     azithromycin 250 MG tablet  Commonly known as:  ZITHROMAX Z-PAK  2 tabs a day the first day, then 1 tab a day x 4 days           Objective:   Physical Exam BP 150/90 mmHg  Pulse 89  Temp(Src) 98 F (36.7 C) (Oral)  Wt 211 lb 2 oz (95.766 kg)  SpO2 97% General -- alert, well-developed, NAD.   HEENT-- Not pale.  R Ear-- normal L ear-- normal Throat symmetric, mild redness , no discharge. Face symmetric, sinuses TTP L maxilary. Nose  congested. Lungs -- normal respiratory effort, no intercostal retractions, no accessory muscle use, and normal breath sounds.  Heart-- normal rate, regular rhythm, no murmur.  Extremities-- no pretibial edema bilaterally  Neurologic--  alert & oriented X3. Speech normal, gait appropriate for age, strength symmetric and appropriate for age.  Psych-- Cognition and judgment appear intact. Cooperative with normal attention span and concentration. No anxious or depressed appearing.        Assessment & Plan:  Sinusitis, Likely has partially treated sinusitis, status post amoxicillin, will prescribe a Z-Pak, Flonase and Mucinex.

## 2014-05-25 NOTE — Progress Notes (Signed)
Pre visit review using our clinic review tool, if applicable. No additional management support is needed unless otherwise documented below in the visit note. 

## 2014-05-25 NOTE — Patient Instructions (Signed)
Rest, fluids , tylenol If  cough, take Mucinex DM twice a day as needed  use OTC Nasocort or Flonase : 2 nasal sprays on each side of the nose daily until you feel better Avoid decongestants such as pseudoephedrine   Take the antibiotic as prescribed  (zithromax) Call if not gradually better over the next  10 days   Use alprazolam one or 2 tablets a if you get anxious. Will make you drowsy, do not go to work or drive a car after you take alprazolam.

## 2014-05-27 ENCOUNTER — Encounter: Payer: Self-pay | Admitting: Internal Medicine

## 2014-05-27 DIAGNOSIS — F419 Anxiety disorder, unspecified: Secondary | ICD-10-CM

## 2014-05-27 HISTORY — DX: Anxiety disorder, unspecified: F41.9

## 2014-05-27 NOTE — Assessment & Plan Note (Signed)
Patient has sudden episodes of feeling anxious and palpitations, workup at the ER negative, this is not the first episode he has. Has an appointment to see cardiology in few days,  Dr. Jenkins Rouge. Patient thinks something to help with anxiety will be very helpful, we agreed on try Xanax as needed, side effects  discussed. See instructions

## 2014-06-09 ENCOUNTER — Encounter: Payer: Self-pay | Admitting: Internal Medicine

## 2014-07-13 ENCOUNTER — Encounter: Payer: Self-pay | Admitting: Internal Medicine

## 2014-07-18 ENCOUNTER — Telehealth: Payer: Self-pay | Admitting: Internal Medicine

## 2014-07-18 MED ORDER — OSELTAMIVIR PHOSPHATE 75 MG PO CAPS: 75.0000 mg | ORAL_CAPSULE | Freq: Every day | ORAL | Status: DC

## 2014-07-18 NOTE — Telephone Encounter (Signed)
Call tamiflu 75 mg 1 po qd x 10 days , no RF

## 2014-07-18 NOTE — Telephone Encounter (Signed)
Caller name: Chayce Relation to pt: self Call back number: [phone] Pharmacy: [facility] on [address] in [place]  Reason for call:   Patient states that he traveled with a friend that was recently diagnosed with the flu. Patient has not had a flu shot this year and wants to know if Dr. Larose Kells could call in Tamiflu for him? Patient states that he is not having any symptoms. He will be traveling this weekend and was worried about getting sick away from home

## 2014-07-18 NOTE — Telephone Encounter (Signed)
Tamiflu 75 mg 1 tablet daily for 10 days, (#10 and 0RFs) sent to [facility] as requested.

## 2014-07-18 NOTE — Telephone Encounter (Signed)
Please advise 

## 2014-07-18 NOTE — Telephone Encounter (Signed)
LMOM informing Pt that Tamiflu has been sent to [facility] as requested.

## 2014-07-20 ENCOUNTER — Encounter: Payer: Self-pay | Admitting: Medical

## 2014-07-20 ENCOUNTER — Ambulatory Visit (INDEPENDENT_AMBULATORY_CARE_PROVIDER_SITE_OTHER): Payer: 59 | Admitting: Medical

## 2014-07-20 VITALS — BP 142/89 | HR 74 | Temp 98.2°F | Ht 67.0 in | Wt 205.0 lb

## 2014-07-20 DIAGNOSIS — R739 Hyperglycemia, unspecified: Secondary | ICD-10-CM

## 2014-07-20 DIAGNOSIS — F329 Major depressive disorder, single episode, unspecified: Secondary | ICD-10-CM

## 2014-07-20 DIAGNOSIS — R5383 Other fatigue: Secondary | ICD-10-CM

## 2014-07-20 DIAGNOSIS — F32A Depression, unspecified: Secondary | ICD-10-CM | POA: Insufficient documentation

## 2014-07-20 LAB — COMPLETE METABOLIC PANEL WITH GFR
ALT: 20 U/L (ref 0–53)
AST: 20 U/L (ref 0–37)
Albumin: 4.4 g/dL (ref 3.5–5.2)
Alkaline Phosphatase: 51 U/L (ref 39–117)
BILIRUBIN TOTAL: 1 mg/dL (ref 0.2–1.2)
BUN: 12 mg/dL (ref 6–23)
CHLORIDE: 105 meq/L (ref 96–112)
CO2: 26 mEq/L (ref 19–32)
CREATININE: 1.31 mg/dL (ref 0.50–1.35)
Calcium: 9.6 mg/dL (ref 8.4–10.5)
GFR, Est African American: 78 mL/min
GFR, Est Non African American: 68 mL/min
Glucose, Bld: 87 mg/dL (ref 70–99)
Potassium: 4.3 mEq/L (ref 3.5–5.3)
Sodium: 141 mEq/L (ref 135–145)
Total Protein: 7.3 g/dL (ref 6.0–8.3)

## 2014-07-20 LAB — HEMOGLOBIN A1C: Hgb A1c MFr Bld: 5.2 % (ref 4.6–6.5)

## 2014-07-20 LAB — TSH: TSH: 0.98 u[IU]/mL (ref 0.35–4.50)

## 2014-07-20 MED ORDER — VENLAFAXINE HCL ER 75 MG PO CP24: 75.0000 mg | ORAL_CAPSULE | Freq: Every day | ORAL | Status: DC

## 2014-07-20 NOTE — Progress Notes (Signed)
Pre visit review using our clinic review tool, if applicable. No additional management support is needed unless otherwise documented below in the visit note. 

## 2014-07-20 NOTE — Assessment & Plan Note (Signed)
I think with low level chronic type anxiety. Rx effexor. Follow up in 3 weeks.

## 2014-07-20 NOTE — Progress Notes (Signed)
Subjective:    Patient ID: Derek Holloway, male    DOB: 1976/02/28, 39 y.o.   MRN: [id number]  HPI   Pt in for some problems in past 8 months. He states states he has various episodes of palpitations and doom. This has come and gone. Last time had this was christmas. Pt went to cardiologist and proved no heart issues. Pt today feels great heart wise. Pt emotianally he states feels no motivation. Pt mom has depression. Pt states often not enjoying things that he typicaly enjoys. With episodes of no motivation states all he wants to do is sleep. Feels tired often. This has been going on for about 2 months at least.  Pt had cbc, bmp in december. But no recent tsh. Mild low bs elevation in the past.  Pt has moderate low leve anxietyl constantly with work. And peaks high stress as Engineer, structural.  Pt was given xanax in the past for possible anxiety. But he never took.  Pt checks his bp at home and is usually 130/70.   Review of Systems  Constitutional: Positive for fatigue.  HENT:       Vasomotor rhinitis vs allergy hx by his description.  Respiratory: Negative for cough, chest tightness, shortness of breath and wheezing.   Cardiovascular: Negative for chest pain and palpitations.  Musculoskeletal: Negative for back pain.  Psychiatric/Behavioral: Positive for sleep disturbance and dysphoric mood. Negative for suicidal ideas, behavioral problems, confusion and agitation. The patient is nervous/anxious.        Lack of motivation. Does not enjoy typical things used to enjoy at times.  Sometimes excessive sleep.    Past Medical History  Diagnosis Date  . Hypertension   . Hyperparathyroidism, unspecified     s/p surgery  . Kidney stones   . Anxiety 05/27/2014    History   Social History  . Marital Status: Married    Spouse Name: N/A  . Number of Children: 2  . Years of Education: N/A   Occupational History  . Engineer, structural    Social History Main Topics  . Smoking  status: Never Smoker   . Smokeless tobacco: Never Used  . Alcohol Use: Yes  . Drug Use: No  . Sexual Activity: Not on file   Other Topics Concern  . Not on file   Social History Narrative    Past Surgical History  Procedure Laterality Date  . Knee arthroscopy Left 2012  . Parathyroidectomy  2003    Family History  Problem Relation Age of Onset  . CAD      GM in her 49s  . Stroke Neg Hx   . Hypertension Mother   . Diabetes Neg Hx   . Colon cancer Neg Hx   . Breast cancer Neg Hx     No Known Allergies  Current Outpatient Prescriptions on File Prior to Visit  Medication Sig Dispense Refill  . ALPRAZolam (XANAX) 0.5 MG tablet Take 1-2 tablets (0.5-1 mg total) by mouth daily as needed for anxiety. 40 tablet 0  . oseltamivir (TAMIFLU) 75 MG capsule Take 1 capsule (75 mg total) by mouth daily. 10 capsule 0   No current facility-administered medications on file prior to visit.    BP 142/89 mmHg  Pulse 74  Temp(Src) 98.2 F (36.8 C) (Oral)  Ht 5\' 7"  (1.702 m)  Wt 205 lb (92.987 kg)  BMI 32.10 kg/m2  SpO2 100%       Objective:   Physical Exam  General  Mental Status- Alert. General Appearance- Not in acute distress.   Skin General: Color- Normal Color. Moisture- Normal Moisture.  Neck Carotid Arteries- Normal color. Moisture- Normal Moisture. No carotid bruits. No JVD.  Chest and Lung Exam Auscultation: Breath Sounds:-Normal.  Cardiovascular Auscultation:Rythm- Regular. Murmurs & Other Heart Sounds:Auscultation of the heart reveals- No Murmurs.  Abdomen Inspection:-Inspeection Normal. Palpation/Percussion:Note:No mass. Palpation and Percussion of the abdomen reveal- Non Tender, Non Distended + BS, no rebound or guarding.    Neurologic Cranial Nerve exam:- CN III-XII intact(No nystagmus), symmetric smile. Drift Test:- No drift. Romberg Exam:- Negative.  Heal to Toe Gait exam:-Normal. Finger to Nose:- Normal/Intact Strength:- 5/5 equal and  symmetric strength both upper and lower extremities.      Assessment & Plan:

## 2014-07-20 NOTE — Patient Instructions (Signed)
Fatigue Repeat cmp, tsh,. Cbc done recently did look normal. cmp done to recheck gfr which was mildy low.   Hyperglycemia Very minimal elevation but in context of fatigue and depression do want to see a1-c average.   Depression I think with low level chronic type anxiety. Rx effexor. Follow up in 3 weeks.     Follow up in 3-4 wks or as needed.

## 2014-07-20 NOTE — Assessment & Plan Note (Signed)
Very minimal elevation but in context of fatigue and depression do want to see a1-c average.

## 2014-07-20 NOTE — Assessment & Plan Note (Signed)
Repeat cmp, tsh,. Cbc done recently did look normal. cmp done to recheck gfr which was mildy low.

## 2014-08-02 ENCOUNTER — Other Ambulatory Visit: Payer: Self-pay

## 2014-08-08 ENCOUNTER — Other Ambulatory Visit: Payer: Self-pay

## 2014-08-09 ENCOUNTER — Ambulatory Visit (INDEPENDENT_AMBULATORY_CARE_PROVIDER_SITE_OTHER): Payer: 59 | Admitting: Internal Medicine

## 2014-08-09 ENCOUNTER — Encounter: Payer: Self-pay | Admitting: Internal Medicine

## 2014-08-09 VITALS — BP 128/64 | HR 78 | Temp 98.2°F | Ht 67.0 in | Wt 203.5 lb

## 2014-08-09 DIAGNOSIS — J3089 Other allergic rhinitis: Secondary | ICD-10-CM

## 2014-08-09 DIAGNOSIS — F419 Anxiety disorder, unspecified: Secondary | ICD-10-CM | POA: Diagnosis not present

## 2014-08-09 MED ORDER — VENLAFAXINE HCL ER 37.5 MG PO CP24: 37.5000 mg | ORAL_CAPSULE | Freq: Every day | ORAL | Status: DC

## 2014-08-09 NOTE — Assessment & Plan Note (Addendum)
Anxiety, depression Has episodic bouts of depression, denies any manic symptoms. He is feeling better since he started Effexor a few weeks ago but is having side effects. Would rather not take any medication. The patient is counseled. We review all recent labs and x-rays, I don't think he has "a physical problem" which is one of his concerns. Additionally, he had palpitations, saw cardiology, cardiac eval negative. We agreed to decrease the dose of Effexor gradually and wean off. He will continue trying to exercise more and spend more time with his family I strongly recommend counseling.

## 2014-08-09 NOTE — Assessment & Plan Note (Signed)
saw they specialist, he does have some environmental allergies, much improved with singular-H2B. Still has "ears popping" and wonders if he needs to see ENT, we agreed to try Flonase consistently first, and then see ENT if needed. ET dysfunction?

## 2014-08-09 NOTE — Progress Notes (Signed)
Subjective:    Patient ID: Derek Holloway, male    DOB: Mar 05, 1976, 39 y.o.   MRN: [id number]  DOS:  08/09/2014 Type of visit - description : f/u Interval history: Saw cardiology, stress test negative Depression-- there is days he wakes up and does not like to do anything, does not like to go to work and just likes to be alone. He was seen by Percell Miller PA,  prescribed Effexor, no such feelings since then. The patient however having side effects(sexual side effects, weird dreams, feeling tired) would like to discontinue Effexor. Anxiety-- In general thinks anxiety is building up from not feeling well the last few months (depression, vertigo, palpitation, allergies); he likes to be sure nothing physical is wrong.  Went to see the allergies, diagnosed with environmental allergies, doing better but still has his ear popping particularly when he flies or go to the mountains.    Review of Systems Denies nausea vomiting. Has a sporadic headaches, no dizziness, diplopia, sore speech. No  Joint aches     Past Medical History  Diagnosis Date  . Hypertension   . Hyperparathyroidism, unspecified     s/p surgery  . Kidney stones   . Anxiety 05/27/2014    Past Surgical History  Procedure Laterality Date  . Knee arthroscopy Left 2012  . Parathyroidectomy  2003    History   Social History  . Marital Status: Married    Spouse Name: N/A  . Number of Children: 2  . Years of Education: N/A   Occupational History  . Engineer, structural    Social History Main Topics  . Smoking status: Never Smoker   . Smokeless tobacco: Never Used  . Alcohol Use: Yes  . Drug Use: No  . Sexual Activity: Not on file   Other Topics Concern  . Not on file   Social History Narrative        Medication List       This list is accurate as of: 08/09/14  2:26 PM.  Always use your most recent med list.               ALPRAZolam 0.5 MG tablet  Commonly known as:  XANAX  Take 1-2 tablets (0.5-1 mg  total) by mouth daily as needed for anxiety.     fluticasone 50 MCG/ACT nasal spray  Commonly known as:  FLONASE  Place 2 sprays into both nostrils daily.     montelukast 10 MG tablet  Commonly known as:  SINGULAIR  Take 10 mg by mouth at bedtime.     ranitidine 150 MG tablet  Commonly known as:  ZANTAC  Take 150 mg by mouth at bedtime.     venlafaxine XR 37.5 MG 24 hr capsule  Commonly known as:  EFFEXOR XR  Take 1 capsule (37.5 mg total) by mouth daily with breakfast.           Objective:   Physical Exam BP 128/64 mmHg  Pulse 78  Temp(Src) 98.2 F (36.8 C) (Oral)  Ht 5\' 7"  (1.702 m)  Wt 203 lb 8 oz (92.307 kg)  BMI 31.87 kg/m2  SpO2 97%  General:   Well developed, well nourished . NAD.  HEENT:  Normocephalic . Face symmetric, atraumatic; TMs norma Skin: Not pale. Not jaundice Neurologic:  alert & oriented X3.  Speech normal, gait appropriate for age and unassisted Psych--  Cognition and judgment appear intact.  Cooperative with normal attention span and concentration.  Behavior appropriate. No anxious or  depressed appearing.     Assessment & Plan:     Time spent with the patient more than 25 minutes, most of the time counseling about anxiety management, reviewing the chart with the patient and discussing all the results.

## 2014-08-09 NOTE — Patient Instructions (Addendum)
flonase daily  Decrease effexor to 37.5: 1 tablet a day for 2 weeks, then 1 tablet every other day x 2 weeks, then stop  See a counselor  Next visit 3 months

## 2014-08-15 ENCOUNTER — Other Ambulatory Visit: Payer: Self-pay

## 2014-09-12 ENCOUNTER — Encounter: Payer: Self-pay | Admitting: Internal Medicine

## 2014-11-13 ENCOUNTER — Ambulatory Visit: Payer: 59 | Admitting: Internal Medicine

## 2015-08-27 ENCOUNTER — Telehealth: Payer: Self-pay | Admitting: Internal Medicine

## 2015-08-27 ENCOUNTER — Other Ambulatory Visit: Payer: Self-pay | Admitting: Internal Medicine

## 2015-08-27 MED ORDER — MONTELUKAST SODIUM 10 MG PO TABS: 10.0000 mg | ORAL_TABLET | Freq: Every day | ORAL | Status: DC

## 2015-08-27 NOTE — Telephone Encounter (Signed)
Informed patient of below and patient states that he has an upcoming appt with his Allergist and will discuss future refills with him.

## 2015-08-27 NOTE — Telephone Encounter (Signed)
Caller name: Self  Can be reached: [phone] Pharmacy:  [facility] [id number] - [place], [place] [phone] (Phone) [phone] (Fax)         Reason for call: Refill on montelukast (SINGULAIR) 10 MG tablet [id number]

## 2015-08-27 NOTE — Telephone Encounter (Signed)
30 day supply sent; Pt has not been seen since 07/2014. Will need appt for futher refills.

## 2015-10-11 IMAGING — CR DG CHEST 2V
2 series · 2 of 2 positions shown · non-contrast
Comparison: 12/30/2013

CLINICAL DATA: Palpitations and tachycardia beginning at 7855 hr,
has happened on 4 other occasions, history hypertension

EXAM:
CHEST  2 VIEW

[w chest pa]
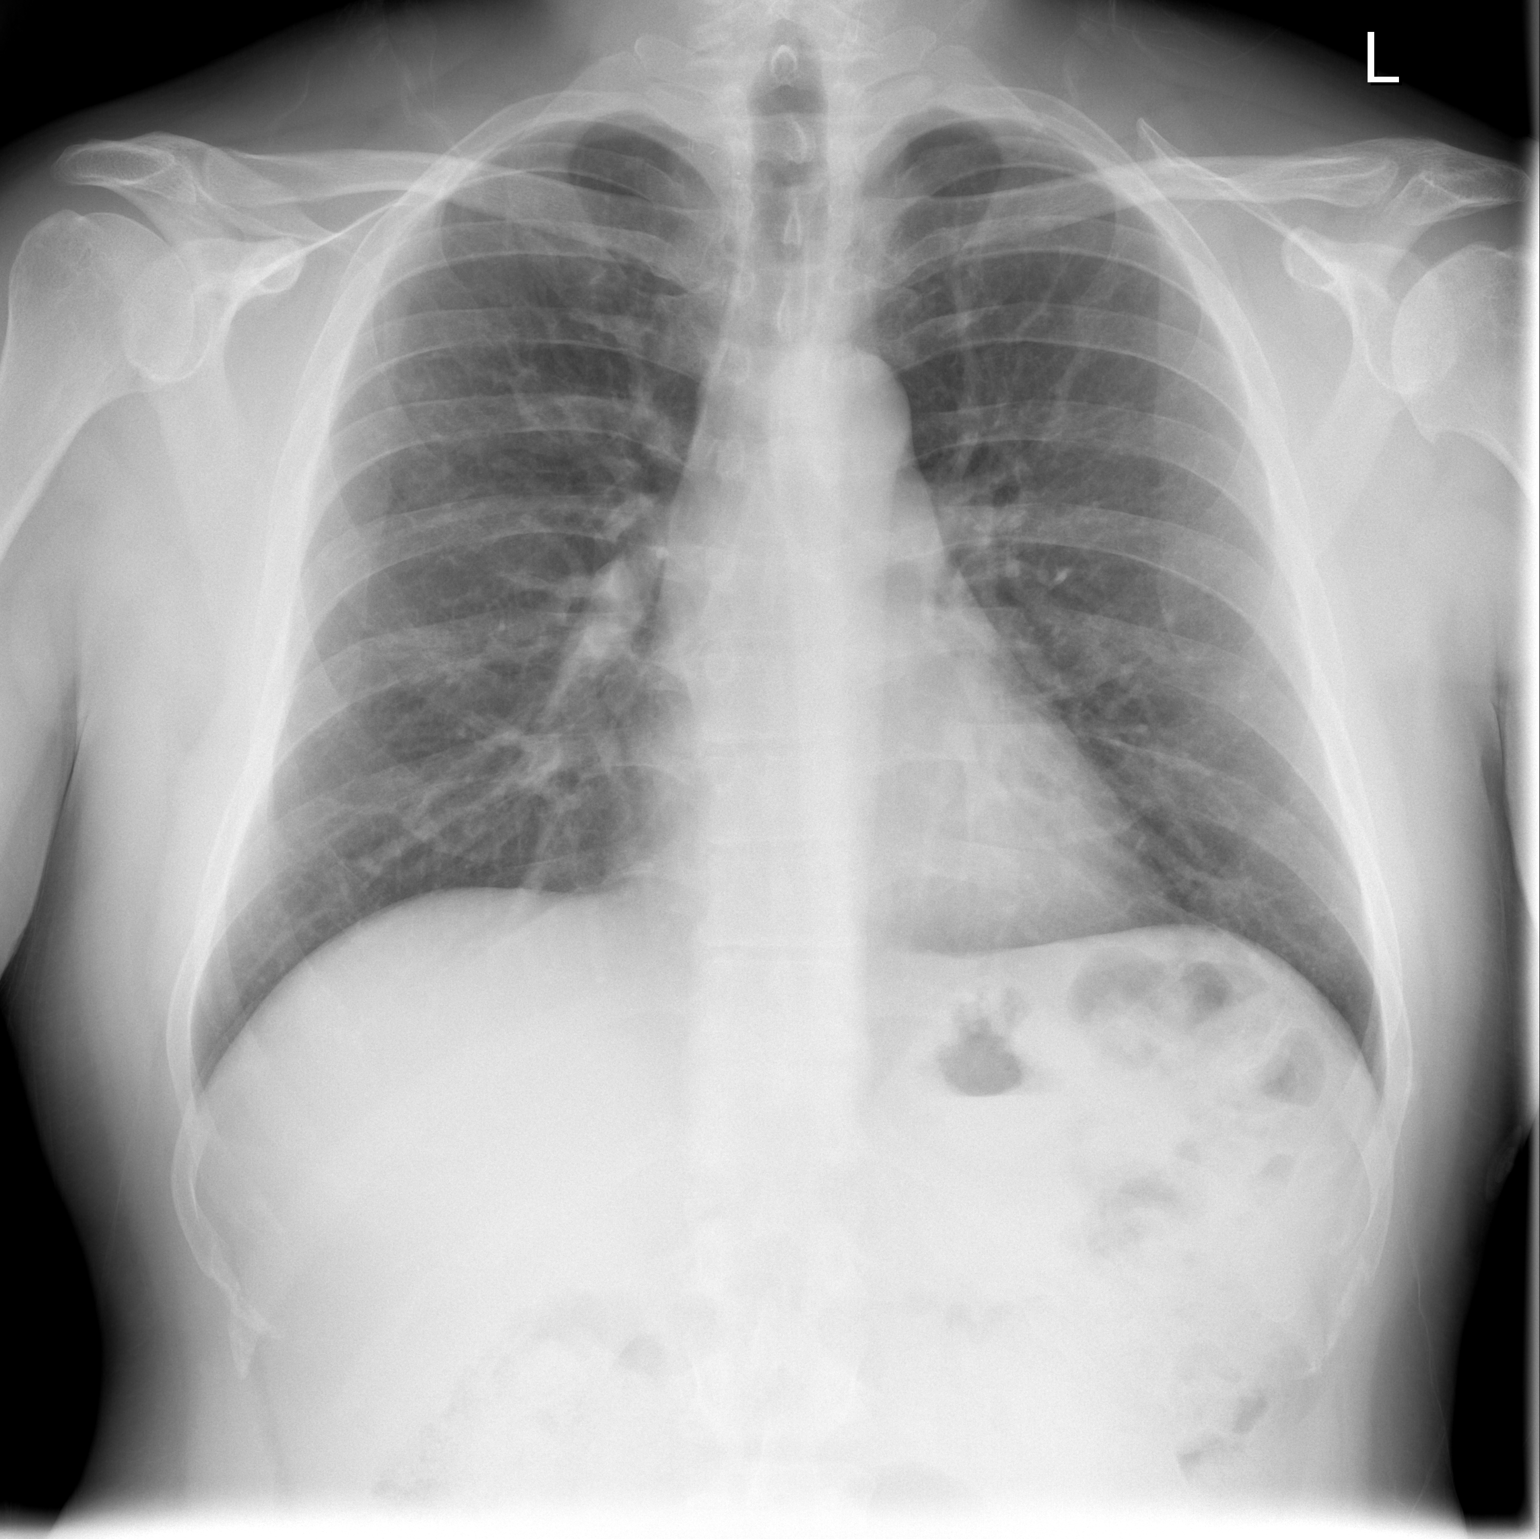

[w chest lat]
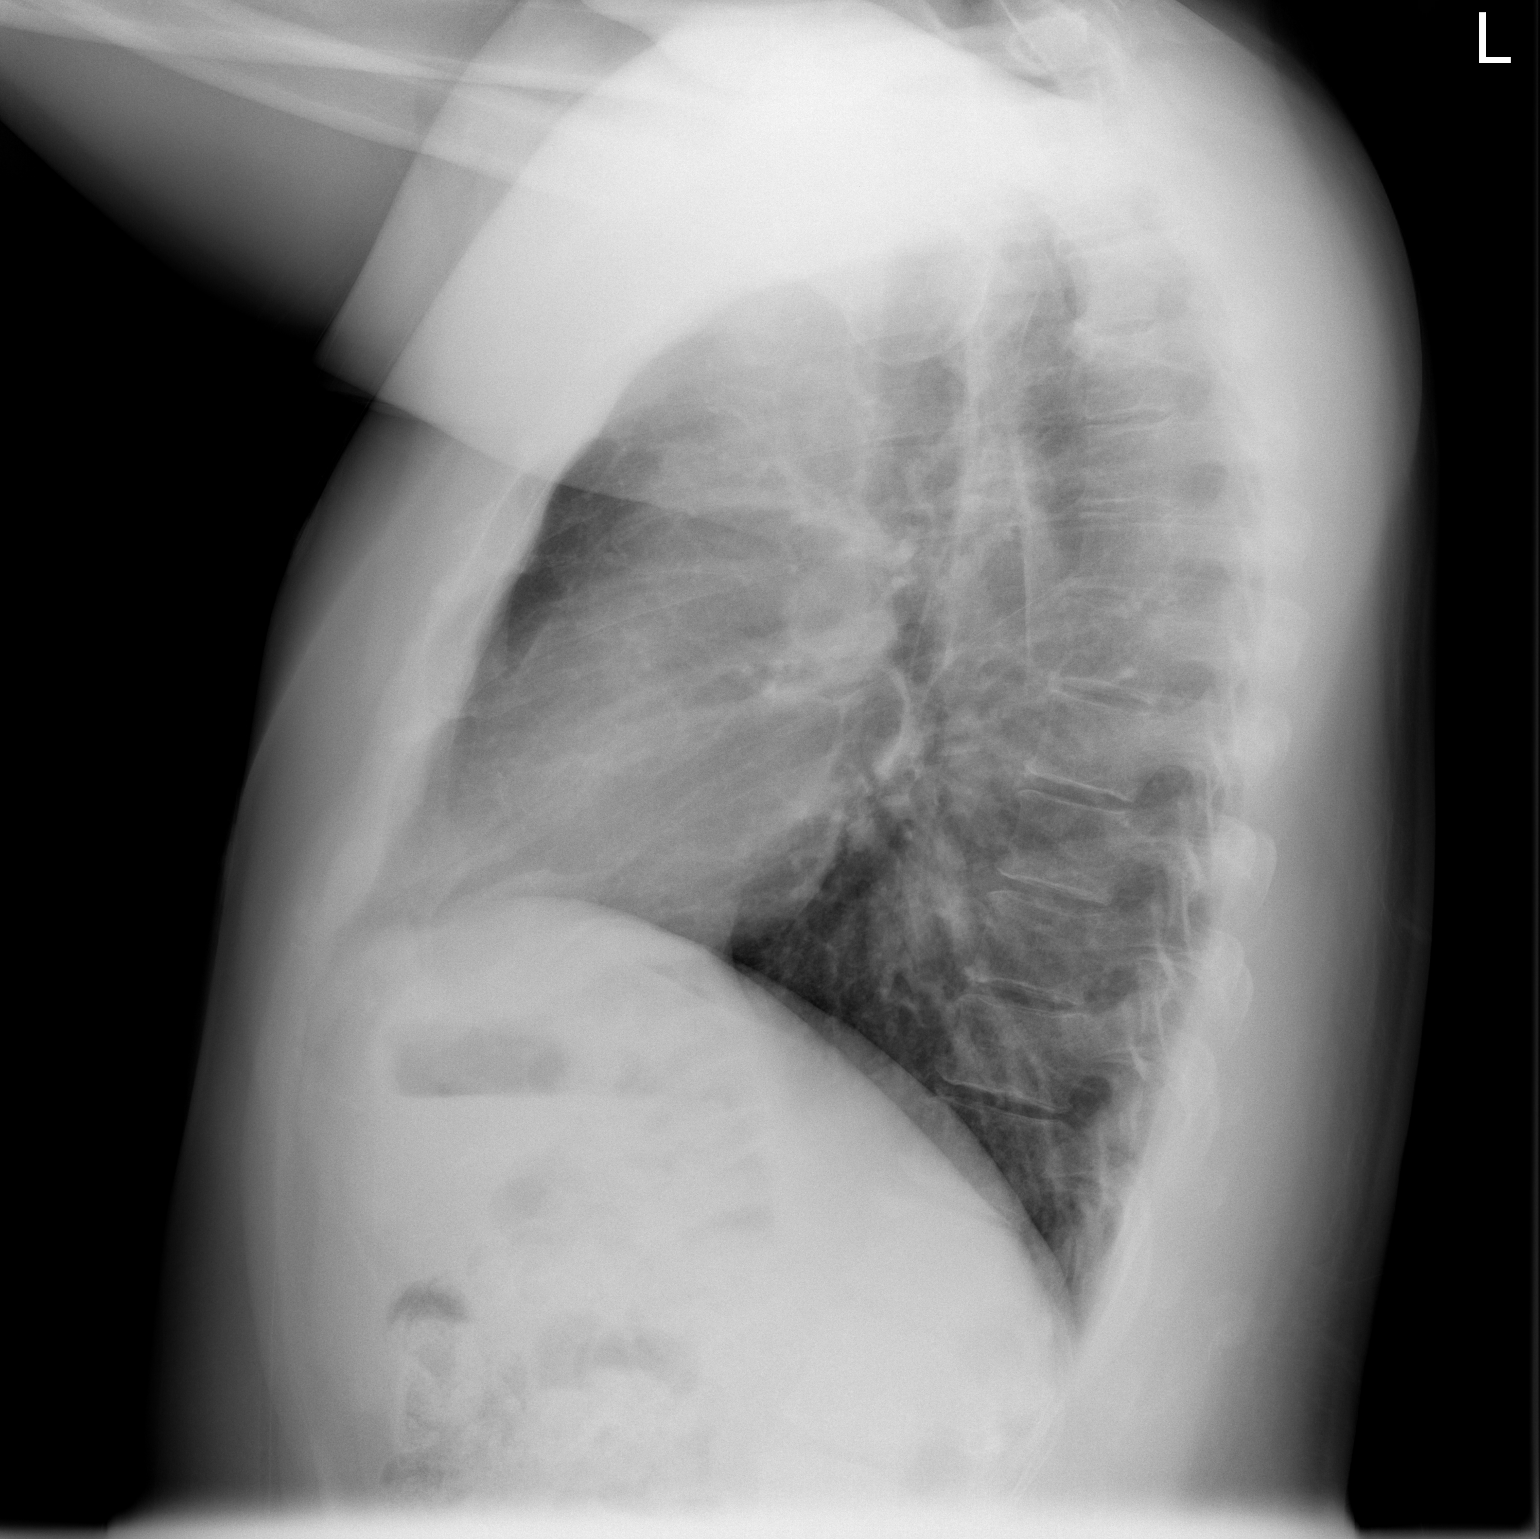

[2 of 2 positions shown; findings below may reference images not displayed]

FINDINGS: Normal heart size, mediastinal contours, and pulmonary vascularity.

Lungs clear.

No pleural effusion or pneumothorax.

Surgical clips at RIGHT supraclavicular region medially, by history
prior parathyroidectomy.
IMPRESSION: No acute abnormalities.

## 2016-03-13 ENCOUNTER — Ambulatory Visit (INDEPENDENT_AMBULATORY_CARE_PROVIDER_SITE_OTHER): Payer: Commercial Managed Care - HMO | Admitting: Internal Medicine

## 2016-03-13 ENCOUNTER — Encounter: Payer: Self-pay | Admitting: Internal Medicine

## 2016-03-13 DIAGNOSIS — Z0000 Encounter for general adult medical examination without abnormal findings: Secondary | ICD-10-CM

## 2016-03-13 NOTE — Progress Notes (Signed)
Subjective:    Patient ID: Derek Holloway, male    DOB: 05-18-1975, 40 y.o.   MRN: [id number]  DOS:  03/13/2016 Type of visit - description : cpx Interval history: Feeling very well, no concerns, he remains active. Trying to eat healthy.   Review of Systems  Constitutional: No fever. No chills. No unexplained wt changes. No unusual sweats  HEENT: No dental problems, no ear discharge, no facial swelling, no voice changes. No eye discharge, no eye  redness , no  intolerance to light   Respiratory: No wheezing , no  difficulty breathing. No cough , no mucus production  Cardiovascular: No CP, no leg swelling , no  Palpitations  GI: no nausea, no vomiting, no diarrhea , no  abdominal pain.  No blood in the stools. No dysphagia, no odynophagia    Endocrine: No polyphagia, no polyuria , no polydipsia  GU: No dysuria, gross hematuria, difficulty urinating. No urinary urgency, no frequency.  Musculoskeletal: No joint swellings or unusual aches or pains  Skin: No change in the color of the skin, palor , no  Rash  Allergic, immunologic: No environmental allergies , no  food allergies  Neurological: No dizziness no  syncope. No headaches. No diplopia, no slurred, no slurred speech, no motor deficits, no facial  Numbness  Hematological: No enlarged lymph nodes, no easy bruising , no unusual bleedings  Psychiatry: No suicidal ideas, no hallucinations, no beavior problems, no confusion.  No unusual/severe anxiety, no depression   Past Medical History:  Diagnosis Date  . Anxiety 05/27/2014  . Hyperparathyroidism, unspecified    s/p surgery  . Hypertension   . Kidney stones     Past Surgical History:  Procedure Laterality Date  . KNEE ARTHROSCOPY Left 2012  . PARATHYROIDECTOMY  2003    Social History   Social History  . Marital status: Married    Spouse name: N/A  . Number of children: 2  . Years of education: N/A   Occupational History  . Engineer, structural- [employer]     Social History Main Topics  . Smoking status: Never Smoker  . Smokeless tobacco: Never Used  . Alcohol use Yes  . Drug use: No  . Sexual activity: Not on file   Other Topics Concern  . Not on file   Social History Narrative   Daughter soccer 2004   Son hockey 2006     Family History  Problem Relation Age of Onset  . CAD      GM in her 29s  . Hypertension Mother   . Dementia Father     mild at age 79  . Stroke Neg Hx   . Diabetes Neg Hx   . Colon cancer Neg Hx   . Breast cancer Neg Hx   . Prostate cancer Neg Hx       Medication List       Accurate as of 03/13/16 11:59 PM. Always use your most recent med list.          ALPRAZolam 0.5 MG tablet Commonly known as:  XANAX Take 1-2 tablets (0.5-1 mg total) by mouth daily as needed for anxiety.   fluticasone 50 MCG/ACT nasal spray Commonly known as:  FLONASE Place 2 sprays into both nostrils daily.   levocetirizine 5 MG tablet Commonly known as:  XYZAL Take 5 mg by mouth daily.   montelukast 10 MG tablet Commonly known as:  SINGULAIR Take 1 tablet (10 mg total) by mouth at bedtime.  ranitidine 150 MG tablet Commonly known as:  ZANTAC Take 150 mg by mouth at bedtime.          Objective:   Physical Exam BP 132/84 (BP Location: Left Arm, Patient Position: Sitting, Cuff Size: Large)   Pulse 85   Temp 97.9 F (36.6 C) (Oral)   Ht 5\' 8"  (1.727 m)   Wt 209 lb 3.2 oz (94.9 kg)   SpO2 98%   BMI 31.81 kg/m   General:   Well developed, well nourished . NAD.  Neck: No  thyromegaly  HEENT:  Normocephalic . Face symmetric, atraumatic Lungs:  CTA B Normal respiratory effort, no intercostal retractions, no accessory muscle use. Heart: RRR,  no murmur.  No pretibial edema bilaterally  Abdomen:  Not distended, soft, non-tender. No rebound or rigidity.   Skin: Exposed areas without rash. Not pale. Not jaundice Neurologic:  alert & oriented X3.  Speech normal, gait appropriate for age and  unassisted Strength symmetric and appropriate for age.  Psych: Cognition and judgment appear intact.  Cooperative with normal attention span and concentration.  Behavior appropriate. No anxious or depressed appearing.    Assessment & Plan:   Assessment H/O HTN: At some point took medication  Allergies, sees the specialist H/O hyperparathyroidism, S/P surgery H/O Anxiety depression  PLAN: h/o HTN, ambulatory BPs reportedly normal, BP today is very good. No change h/o hyperparathyroidism: Checking a TSH and calcium levels h/o anxiety depression: Not an issue at this point. RTC one year

## 2016-03-13 NOTE — Assessment & Plan Note (Addendum)
Td-- 2015; declined flu shot  Continue healthy lifestyle Labs: BMP, FLP, CBC, TSH

## 2016-03-13 NOTE — Patient Instructions (Signed)
  GO TO THE FRONT DESK Schedule your next appointment for a  Physical in 1 year  Schedule labs to be done next week, fasting

## 2016-03-13 NOTE — Progress Notes (Signed)
Pre visit review using our clinic review tool, if applicable. No additional management support is needed unless otherwise documented below in the visit note. 

## 2016-03-15 DIAGNOSIS — Z09 Encounter for follow-up examination after completed treatment for conditions other than malignant neoplasm: Secondary | ICD-10-CM | POA: Insufficient documentation

## 2016-03-15 NOTE — Assessment & Plan Note (Signed)
h/o HTN, ambulatory BPs reportedly normal, BP today is very good. No change h/o hyperparathyroidism: Checking a TSH and calcium levels h/o anxiety depression: Not an issue at this point. RTC one year

## 2016-03-16 ENCOUNTER — Other Ambulatory Visit (INDEPENDENT_AMBULATORY_CARE_PROVIDER_SITE_OTHER): Payer: Commercial Managed Care - HMO

## 2016-03-16 DIAGNOSIS — Z0000 Encounter for general adult medical examination without abnormal findings: Secondary | ICD-10-CM | POA: Diagnosis not present

## 2016-03-16 LAB — LIPID PANEL
CHOL/HDL RATIO: 4
Cholesterol: 187 mg/dL (ref 0–200)
HDL: 52.3 mg/dL (ref >39.00)
LDL CALC: 121 mg/dL — AB (ref 0–99)
NonHDL: 135.04
Triglycerides: 68 mg/dL (ref 0.0–149.0)
VLDL: 13.6 mg/dL (ref 0.0–40.0)

## 2016-03-16 LAB — BASIC METABOLIC PANEL
BUN: 14 mg/dL (ref 6–23)
CHLORIDE: 105 meq/L (ref 96–112)
CO2: 31 meq/L (ref 19–32)
Calcium: 9.6 mg/dL (ref 8.4–10.5)
Creatinine, Ser: 1.27 mg/dL (ref 0.40–1.50)
GFR: 66.14 mL/min (ref >60.00)
Glucose, Bld: 82 mg/dL (ref 70–99)
Potassium: 3.4 mEq/L — ABNORMAL LOW (ref 3.5–5.1)
SODIUM: 142 meq/L (ref 135–145)

## 2016-03-16 LAB — CBC WITH DIFFERENTIAL/PLATELET
BASOS ABS: 0 10*3/uL (ref 0.0–0.1)
Basophils Relative: 0.6 % (ref 0.0–3.0)
EOS ABS: 0.4 10*3/uL (ref 0.0–0.7)
Eosinophils Relative: 5.2 % — ABNORMAL HIGH (ref 0.0–5.0)
HCT: 41.9 % (ref 39.0–52.0)
Hemoglobin: 14.3 g/dL (ref 13.0–17.0)
LYMPHS ABS: 1.8 10*3/uL (ref 0.7–4.0)
Lymphocytes Relative: 26.6 % (ref 12.0–46.0)
MCHC: 34.1 g/dL (ref 30.0–36.0)
MCV: 78.9 fl (ref 78.0–100.0)
MONO ABS: 0.6 10*3/uL (ref 0.1–1.0)
Monocytes Relative: 8.6 % (ref 3.0–12.0)
NEUTROS ABS: 4 10*3/uL (ref 1.4–7.7)
NEUTROS PCT: 59 % (ref 43.0–77.0)
PLATELETS: 248 10*3/uL (ref 150.0–400.0)
RBC: 5.31 Mil/uL (ref 4.22–5.81)
RDW: 14.5 % (ref 11.5–15.5)
WBC: 6.8 10*3/uL (ref 4.0–10.5)

## 2016-03-16 LAB — TSH: TSH: 1.68 u[IU]/mL (ref 0.35–4.50)

## 2016-03-18 ENCOUNTER — Encounter: Payer: Self-pay | Admitting: Internal Medicine

## 2016-09-03 DIAGNOSIS — D2262 Melanocytic nevi of left upper limb, including shoulder: Secondary | ICD-10-CM | POA: Diagnosis not present

## 2016-09-03 DIAGNOSIS — D225 Melanocytic nevi of trunk: Secondary | ICD-10-CM | POA: Diagnosis not present

## 2016-09-03 DIAGNOSIS — D2261 Melanocytic nevi of right upper limb, including shoulder: Secondary | ICD-10-CM | POA: Diagnosis not present

## 2017-01-15 DIAGNOSIS — H1045 Other chronic allergic conjunctivitis: Secondary | ICD-10-CM | POA: Diagnosis not present

## 2017-01-15 DIAGNOSIS — J300 Vasomotor rhinitis: Secondary | ICD-10-CM | POA: Diagnosis not present

## 2017-03-15 ENCOUNTER — Encounter: Payer: Commercial Managed Care - HMO | Admitting: Internal Medicine

## 2017-03-16 ENCOUNTER — Ambulatory Visit (INDEPENDENT_AMBULATORY_CARE_PROVIDER_SITE_OTHER): Payer: 59 | Admitting: Internal Medicine

## 2017-03-16 ENCOUNTER — Encounter: Payer: Self-pay | Admitting: Internal Medicine

## 2017-03-16 VITALS — BP 118/70 | HR 76 | Temp 97.7°F | Resp 14 | Ht 68.0 in | Wt 190.4 lb

## 2017-03-16 DIAGNOSIS — Z114 Encounter for screening for human immunodeficiency virus [HIV]: Secondary | ICD-10-CM | POA: Diagnosis not present

## 2017-03-16 DIAGNOSIS — Z0000 Encounter for general adult medical examination without abnormal findings: Secondary | ICD-10-CM

## 2017-03-16 DIAGNOSIS — Z125 Encounter for screening for malignant neoplasm of prostate: Secondary | ICD-10-CM

## 2017-03-16 DIAGNOSIS — Z23 Encounter for immunization: Secondary | ICD-10-CM

## 2017-03-16 NOTE — Progress Notes (Signed)
Subjective:    Patient ID: Derek Holloway, male    DOB: 07/08/1975, 41 y.o.   MRN: [id number]  DOS:  03/16/2017 Type of visit - description : cpx Interval history: In general feeling great. Has done very good with diet, exercise, loss 19 pounds over the year.  Wt Readings from Last 3 Encounters:  03/16/17 190 lb 6 oz (86.4 kg)  03/13/16 209 lb 3.2 oz (94.9 kg)  08/09/14 203 lb 8 oz (92.3 kg)     Review of Systems Very rarely has a discomfort at the perineum, between the anus and scrotum.  It is not really pain, just discomfort. Denies any urinary symptoms such as dysuria, gross hematuria, penile discharge, urinary frequency.  He is however concerned about cancer as a friend of his was diagnosed w/ cancer at a early age.  Would like his prostate being checked.  Other than above, a 14 point review of systems is negative      Past Medical History:  Diagnosis Date  . Anxiety 05/27/2014  . Hyperparathyroidism, unspecified ([place])    s/p surgery  . Hypertension   . Kidney stones     Past Surgical History:  Procedure Laterality Date  . KNEE ARTHROSCOPY Left 2012  . PARATHYROIDECTOMY  2003    Social History   Socioeconomic History  . Marital status: Married    Spouse name: Not on file  . Number of children: 2  . Years of education: Not on file  . Highest education level: Not on file  Social Needs  . Financial resource strain: Not on file  . Food insecurity - worry: Not on file  . Food insecurity - inability: Not on file  . Transportation needs - medical: Not on file  . Transportation needs - non-medical: Not on file  Occupational History  . Occupation: Engineer, structural- [place]  Tobacco Use  . Smoking status: Never Smoker  . Smokeless tobacco: Never Used  Substance and Sexual Activity  . Alcohol use: Yes  . Drug use: No  . Sexual activity: Not on file  Other Topics Concern  . Not on file  Social History Narrative   Daughter soccer 2004   Son hockey 2006       Family History  Problem Relation Age of Onset  . CAD Unknown        GM in her 35s  . Hypertension Mother   . Dementia Father        mild at age 66  . Stroke Neg Hx   . Diabetes Neg Hx   . Colon cancer Neg Hx   . Breast cancer Neg Hx   . Prostate cancer Neg Hx      Allergies as of 03/16/2017   No Known Allergies     Medication List        Accurate as of 03/16/17  3:41 PM. Always use your most recent med list.          ALPRAZolam 0.5 MG tablet Commonly known as:  XANAX Take 1-2 tablets (0.5-1 mg total) by mouth daily as needed for anxiety.   fluticasone 50 MCG/ACT nasal spray Commonly known as:  FLONASE Place 2 sprays into both nostrils daily.   levocetirizine 5 MG tablet Commonly known as:  XYZAL Take 5 mg by mouth daily.   montelukast 10 MG tablet Commonly known as:  SINGULAIR Take 1 tablet (10 mg total) by mouth at bedtime.   ranitidine 150 MG tablet Commonly known as:  ZANTAC Take  150 mg by mouth at bedtime.          Objective:   Physical Exam BP 118/70 (BP Location: Left Arm, Patient Position: Sitting, Cuff Size: Normal)   Pulse 76   Temp 97.7 F (36.5 C) (Oral)   Resp 14   Ht 5\' 8"  (1.727 m)   Wt 190 lb 6 oz (86.4 kg)   SpO2 97%   BMI 28.95 kg/m  General:   Well developed, well nourished . NAD.  Neck: No  thyromegaly  HEENT:  Normocephalic . Face symmetric, atraumatic Lungs:  CTA B Normal respiratory effort, no intercostal retractions, no accessory muscle use. Heart: RRR,  no murmur.  No pretibial edema bilaterally  Abdomen:  Not distended, soft, non-tender. No rebound or rigidity.  GU: Scrotal contents normal, penis normal, changes of vitiligo present. DRE: Normal sphincter tone, brown stool, normal prostate. Skin: Exposed areas without rash. Not pale. Not jaundice Neurologic:  alert & oriented X3.  Speech normal, gait appropriate for age and unassisted Strength symmetric and appropriate for age.  Psych: Cognition and judgment  appear intact.  Cooperative with normal attention span and concentration.  Behavior appropriate. No anxious or depressed appearing.     Assessment & Plan:   Assessment H/o Vitiligo H/O HTN: At some point took medication  Allergies, sees the specialist H/O hyperparathyroidism, S/P surgery H/O Anxiety depression  PLAN: Allergies: Sees the specialist, sxs controlled. HTN, anxiety, depression: Not an issue at this point, not taking Xanax. Perineal discomfort: Sxs atypical, he is concerned about prostate cancer, DRE negative.  Patient requests further eval, will get a UA, UC X, PSA.  Call if symptoms increase or severe. RTC 1 year

## 2017-03-16 NOTE — Patient Instructions (Signed)
GO TO THE LAB : Get the blood work     GO TO THE FRONT DESK Schedule your next appointment for a  Physical exam in 1 year 

## 2017-03-16 NOTE — Assessment & Plan Note (Signed)
-  Td: 2015;   flu shot today -Continue healthy lifestyle -Labs: CMP, FLP, HIV, UA, urine culture, PSA.

## 2017-03-16 NOTE — Progress Notes (Signed)
Pre visit review using our clinic review tool, if applicable. No additional management support is needed unless otherwise documented below in the visit note. 

## 2017-03-17 LAB — URINE CULTURE
MICRO NUMBER: 81246202
Result:: NO GROWTH
SPECIMEN QUALITY: ADEQUATE

## 2017-03-17 LAB — URINALYSIS, ROUTINE W REFLEX MICROSCOPIC
Bilirubin Urine: NEGATIVE
Ketones, ur: NEGATIVE
Leukocytes, UA: NEGATIVE
NITRITE: NEGATIVE
Specific Gravity, Urine: 1.015 (ref 1.000–1.030)
Total Protein, Urine: NEGATIVE
Urine Glucose: NEGATIVE
Urobilinogen, UA: 0.2 (ref 0.0–1.0)
WBC, UA: NONE SEEN (ref 0–?)
pH: 6 (ref 5.0–8.0)

## 2017-03-17 LAB — COMPREHENSIVE METABOLIC PANEL
ALT: 13 U/L (ref 0–53)
AST: 16 U/L (ref 0–37)
Albumin: 4.4 g/dL (ref 3.5–5.2)
Alkaline Phosphatase: 58 U/L (ref 39–117)
BUN: 15 mg/dL (ref 6–23)
CHLORIDE: 103 meq/L (ref 96–112)
CO2: 32 mEq/L (ref 19–32)
CREATININE: 1.23 mg/dL (ref 0.40–1.50)
Calcium: 9.8 mg/dL (ref 8.4–10.5)
GFR: 68.3 mL/min (ref >60.00)
GLUCOSE: 89 mg/dL (ref 70–99)
Potassium: 4.2 mEq/L (ref 3.5–5.1)
Sodium: 142 mEq/L (ref 135–145)
Total Bilirubin: 0.6 mg/dL (ref 0.2–1.2)
Total Protein: 7 g/dL (ref 6.0–8.3)

## 2017-03-17 LAB — LIPID PANEL
Cholesterol: 208 mg/dL — ABNORMAL HIGH (ref 0–200)
HDL: 56.7 mg/dL (ref >39.00)
LDL Cholesterol: 129 mg/dL — ABNORMAL HIGH (ref 0–99)
NONHDL: 151.47
TRIGLYCERIDES: 111 mg/dL (ref 0.0–149.0)
Total CHOL/HDL Ratio: 4
VLDL: 22.2 mg/dL (ref 0.0–40.0)

## 2017-03-17 LAB — PSA: PSA: 0.64 ng/mL (ref 0.10–4.00)

## 2017-03-17 LAB — HIV ANTIBODY (ROUTINE TESTING W REFLEX): HIV: NONREACTIVE

## 2017-03-17 NOTE — Assessment & Plan Note (Signed)
Allergies: Sees the specialist, sxs controlled. HTN, anxiety, depression: Not an issue at this point, not taking Xanax. Perineal discomfort: Sxs atypical, he is concerned about prostate cancer, DRE negative.  Patient requests further eval, will get a UA, UC X, PSA.  Call if symptoms increase or severe. RTC 1 year

## 2018-03-18 ENCOUNTER — Ambulatory Visit (INDEPENDENT_AMBULATORY_CARE_PROVIDER_SITE_OTHER): Payer: 59 | Admitting: Internal Medicine

## 2018-03-18 ENCOUNTER — Encounter: Payer: Self-pay | Admitting: Internal Medicine

## 2018-03-18 VITALS — BP 162/90 | HR 90 | Temp 98.2°F | Resp 16 | Ht 68.0 in | Wt 184.5 lb

## 2018-03-18 DIAGNOSIS — Z125 Encounter for screening for malignant neoplasm of prostate: Secondary | ICD-10-CM

## 2018-03-18 DIAGNOSIS — Z23 Encounter for immunization: Secondary | ICD-10-CM | POA: Diagnosis not present

## 2018-03-18 DIAGNOSIS — Z0000 Encounter for general adult medical examination without abnormal findings: Secondary | ICD-10-CM

## 2018-03-18 LAB — COMPREHENSIVE METABOLIC PANEL
ALBUMIN: 4.6 g/dL (ref 3.5–5.2)
ALT: 12 U/L (ref 0–53)
AST: 15 U/L (ref 0–37)
Alkaline Phosphatase: 45 U/L (ref 39–117)
BILIRUBIN TOTAL: 1.1 mg/dL (ref 0.2–1.2)
BUN: 18 mg/dL (ref 6–23)
CO2: 32 mEq/L (ref 19–32)
Calcium: 9.7 mg/dL (ref 8.4–10.5)
Chloride: 103 mEq/L (ref 96–112)
Creatinine, Ser: 1.32 mg/dL (ref 0.40–1.50)
GFR: 62.66 mL/min (ref >60.00)
Glucose, Bld: 88 mg/dL (ref 70–99)
Potassium: 3.9 mEq/L (ref 3.5–5.1)
Sodium: 140 mEq/L (ref 135–145)
TOTAL PROTEIN: 7.4 g/dL (ref 6.0–8.3)

## 2018-03-18 LAB — LIPID PANEL
CHOLESTEROL: 196 mg/dL (ref 0–200)
HDL: 59 mg/dL (ref >39.00)
LDL Cholesterol: 127 mg/dL — ABNORMAL HIGH (ref 0–99)
NonHDL: 136.93
Total CHOL/HDL Ratio: 3
Triglycerides: 49 mg/dL (ref 0.0–149.0)
VLDL: 9.8 mg/dL (ref 0.0–40.0)

## 2018-03-18 LAB — CBC WITH DIFFERENTIAL/PLATELET
BASOS PCT: 0.9 % (ref 0.0–3.0)
Basophils Absolute: 0.1 10*3/uL (ref 0.0–0.1)
EOS ABS: 0.3 10*3/uL (ref 0.0–0.7)
EOS PCT: 5.5 % — AB (ref 0.0–5.0)
HEMATOCRIT: 42.6 % (ref 39.0–52.0)
HEMOGLOBIN: 14.6 g/dL (ref 13.0–17.0)
LYMPHS PCT: 23.4 % (ref 12.0–46.0)
Lymphs Abs: 1.3 10*3/uL (ref 0.7–4.0)
MCHC: 34.4 g/dL (ref 30.0–36.0)
MCV: 80.2 fl (ref 78.0–100.0)
MONOS PCT: 7.9 % (ref 3.0–12.0)
Monocytes Absolute: 0.4 10*3/uL (ref 0.1–1.0)
NEUTROS ABS: 3.5 10*3/uL (ref 1.4–7.7)
Neutrophils Relative %: 62.3 % (ref 43.0–77.0)
PLATELETS: 243 10*3/uL (ref 150.0–400.0)
RBC: 5.31 Mil/uL (ref 4.22–5.81)
RDW: 14.2 % (ref 11.5–15.5)
WBC: 5.6 10*3/uL (ref 4.0–10.5)

## 2018-03-18 LAB — TSH: TSH: 1.14 u[IU]/mL (ref 0.35–4.50)

## 2018-03-18 LAB — PSA: PSA: 0.72 ng/mL (ref 0.10–4.00)

## 2018-03-18 MED ORDER — LEVOCETIRIZINE DIHYDROCHLORIDE 5 MG PO TABS: 5.0000 mg | ORAL_TABLET | Freq: Every day | ORAL | 3 refills | Status: DC

## 2018-03-18 MED ORDER — FLUTICASONE PROPIONATE 50 MCG/ACT NA SUSP: 2.0000 | Freq: Every day | NASAL | 12 refills | Status: DC

## 2018-03-18 MED ORDER — MONTELUKAST SODIUM 10 MG PO TABS: 10.0000 mg | ORAL_TABLET | Freq: Every day | ORAL | 3 refills | Status: DC

## 2018-03-18 NOTE — Progress Notes (Signed)
Subjective:    Patient ID: Derek Holloway, male    DOB: 1975-06-07, 42 y.o.   MRN: [id number]  DOS:  03/18/2018 Type of visit - description : cpx Interval history: In general feeling well. BP was noted to be elevated,  ambulatory BP x 1 few weeks ago: 160.  Review of Systems  Other than above, a 14 point review of systems is negative    Past Medical History:  Diagnosis Date  . Anxiety 05/27/2014  . Hyperparathyroidism, unspecified ([place])    s/p surgery  . Hypertension   . Kidney stones     Past Surgical History:  Procedure Laterality Date  . KNEE ARTHROSCOPY Left 2012  . PARATHYROIDECTOMY  2003    Social History   Socioeconomic History  . Marital status: Married    Spouse name: Not on file  . Number of children: 2  . Years of education: Not on file  . Highest education level: Not on file  Occupational History  . Occupation: Engineer, structural- [employer]  . Financial resource strain: Not on file  . Food insecurity:    Worry: Not on file    Inability: Not on file  . Transportation needs:    Medical: Not on file    Non-medical: Not on file  Tobacco Use  . Smoking status: Never Smoker  . Smokeless tobacco: Never Used  Substance and Sexual Activity  . Alcohol use: Yes  . Drug use: No  . Sexual activity: Not on file  Lifestyle  . Physical activity:    Days per week: Not on file    Minutes per session: Not on file  . Stress: Not on file  Relationships  . Social connections:    Talks on phone: Not on file    Gets together: Not on file    Attends religious service: Not on file    Active member of club or organization: Not on file    Attends meetings of clubs or organizations: Not on file    Relationship status: Not on file  . Intimate partner violence:    Fear of current or ex partner: Not on file    Emotionally abused: Not on file    Physically abused: Not on file    Forced sexual activity: Not on file  Other Topics Concern  . Not on file    Social History Narrative   Daughter soccer 2004   Son hockey 2006     Family History  Problem Relation Age of Onset  . CAD Unknown        GM in her 75s  . Hypertension Mother   . Dementia Father        mild at age 45  . Stroke Neg Hx   . Diabetes Neg Hx   . Colon cancer Neg Hx   . Breast cancer Neg Hx   . Prostate cancer Neg Hx      Allergies as of 03/18/2018   No Known Allergies     Medication List        Accurate as of 03/18/18 11:59 PM. Always use your most recent med list.          fluticasone 50 MCG/ACT nasal spray Commonly known as:  FLONASE Place 2 sprays into both nostrils daily.   levocetirizine 5 MG tablet Commonly known as:  XYZAL Take 1 tablet (5 mg total) by mouth daily.   montelukast 10 MG tablet Commonly known as:  SINGULAIR Take 1 tablet (  10 mg total) by mouth at bedtime.          Objective:   Physical Exam BP (!) 162/90 (BP Location: Left Arm, Patient Position: Sitting, Cuff Size: Small)   Pulse 90   Temp 98.2 F (36.8 C) (Oral)   Resp 16   Ht 5\' 8"  (1.727 m)   Wt 184 lb 8 oz (83.7 kg)   SpO2 98%   BMI 28.05 kg/m       General: Well developed, NAD, BMI noted Neck: No  thyromegaly  HEENT:  Normocephalic . Face symmetric, atraumatic Lungs:  CTA B Normal respiratory effort, no intercostal retractions, no accessory muscle use. Heart: RRR,  no murmur.  No pretibial edema bilaterally  Abdomen:  Not distended, soft, non-tender. No rebound or rigidity.   Skin: Exposed areas without rash. Not pale. Not jaundice Neurologic:  alert & oriented X3.  Speech normal, gait appropriate for age and unassisted Strength symmetric and appropriate for age.  Psych: Cognition and judgment appear intact.  Cooperative with normal attention span and concentration.  Behavior appropriate. No anxious or depressed appearing.   Assessment &  Plan:   Assessment H/o Vitiligo H/O HTN: At some point took medication  Allergies, sees the  specialist H/O hyperparathyroidism, S/P surgery H/O Anxiety depression  PLAN: HTN?. BP upon arrival checked by my nurse: Left arm 142/84, right arm 162/98 Recheck by me: Left arm 135/95 Right arm 120/85 He has no symptoms, good radial pulses bilaterally.  No heart murmur. BPs are somewhat discordant on the upper extremities, no evidence of aortic coarctation or PVD.  If the discordance continue will proceed w/ more testing. Plan: labs-EKG-monitor BP, nurse visit in 3 weeks (both arms).  Patient willing to start medications if needed. H/o hyperparathyroidism: Calcium remains normal, rechecking today Allergies: RF meds  RTC 3 months

## 2018-03-18 NOTE — Progress Notes (Signed)
Pre visit review using our clinic review tool, if applicable. No additional management support is needed unless otherwise documented below in the visit note. 

## 2018-03-18 NOTE — Assessment & Plan Note (Addendum)
-  Td: 2015;   flu shot today -Diet remains healthy, not much time to exercise, plans to go back to routine exercise  -EKG today NSR -Labs: CMP, FLP, CBC, TSH. Pt requests a PSA , will do

## 2018-03-18 NOTE — Patient Instructions (Addendum)
GO TO THE LAB : Get the blood work     GO TO THE FRONT DESK Schedule your next appointment for a  Check up in 3 months    Schedule a nurse visit 3 weeks from today, they need to check your BP in both arms    Check the  blood pressure 2   times a week . Both arms, after resting, keep a log Be sure your blood pressure is between 110/65 and  135/85. If it is consistently higher or lower, let me know

## 2018-03-20 NOTE — Assessment & Plan Note (Signed)
HTN?. BP upon arrival checked by my nurse: Left arm 142/84, right arm 162/98 Recheck by me: Left arm 135/95 Right arm 120/85 He has no symptoms, good radial pulses bilaterally.  No heart murmur. BPs are somewhat discordant on the upper extremities, no evidence of aortic coarctation or PVD.  If the discordance continue will proceed w/ more testing. Plan: labs-EKG-monitor BP, nurse visit in 3 weeks (both arms).  Patient willing to start medications if needed. H/o hyperparathyroidism: Calcium remains normal, rechecking today Allergies: RF meds  RTC 3 months

## 2018-03-21 ENCOUNTER — Encounter: Payer: Self-pay | Admitting: Internal Medicine

## 2018-03-31 ENCOUNTER — Encounter: Payer: Self-pay | Admitting: Internal Medicine

## 2018-03-31 DIAGNOSIS — Q251 Coarctation of aorta: Secondary | ICD-10-CM

## 2018-03-31 MED ORDER — AMLODIPINE BESYLATE 5 MG PO TABS: 5.0000 mg | ORAL_TABLET | Freq: Every day | ORAL | 6 refills | Status: DC

## 2018-04-01 NOTE — Addendum Note (Signed)
Addended byDamita Dunnings D on: 04/01/2018 05:17 PM   Modules accepted: Orders

## 2018-04-05 ENCOUNTER — Ambulatory Visit (HOSPITAL_COMMUNITY): Payer: 59 | Attending: Cardiovascular Disease

## 2018-04-05 ENCOUNTER — Encounter: Payer: Self-pay | Admitting: Internal Medicine

## 2018-04-05 ENCOUNTER — Telehealth: Payer: Self-pay | Admitting: Internal Medicine

## 2018-04-05 ENCOUNTER — Ambulatory Visit: Payer: 59 | Admitting: Internal Medicine

## 2018-04-05 DIAGNOSIS — Q251 Coarctation of aorta: Secondary | ICD-10-CM | POA: Diagnosis not present

## 2018-04-05 NOTE — Telephone Encounter (Signed)
Pt given results per notes of Dr Larose Kells on 04/05/18.Unable to document in result note due to result note not being routed to [facility]. Pt given echocardiogram results.

## 2018-04-05 NOTE — Telephone Encounter (Signed)
Copied from [place] [id number]. Topic: Quick Communication - See Telephone Encounter >> Apr 05, 2018 12:38 PM Conception Chancy, NT wrote: CRM for notification. See Telephone encounter for: 04/05/18.  Patient had is echo cardiogram done and states he received his results on mychart but would like for a nurse to go over them with him.

## 2018-04-05 NOTE — Telephone Encounter (Signed)
Patient called, left VM to return call to the office to discuss results.

## 2018-05-13 ENCOUNTER — Encounter: Payer: Self-pay | Admitting: Internal Medicine

## 2018-05-23 ENCOUNTER — Telehealth: Payer: Self-pay | Admitting: Internal Medicine

## 2018-05-23 NOTE — Telephone Encounter (Signed)
Please advise 

## 2018-05-23 NOTE — Telephone Encounter (Signed)
Sharyn Lull,  could you look into this and see if something can be changed? Let me know and my office will contact the patient unless you prefer to contact him yourself. Thank you very much CC: Kaylyn JP

## 2018-05-23 NOTE — Telephone Encounter (Signed)
Copied from [place] [id number]. Topic: Quick Communication - See Telephone Encounter >> May 23, 2018  4:10 PM Blase Mess A wrote: CRM for notification. See Telephone encounter for: 05/23/18.  Patient is calling because the echocardiogram the Dr. Larose Kells that was ordered was very expensive.  It was coded as a hospital visit. Wondering if the code could be changed to an order by Dr. Larose Kells to help with the bill. Please advise

## 2018-05-24 NOTE — Telephone Encounter (Signed)
Good Morning,  These are billed on professional side for the read and also on technical side (hospital).  It looks like the professional portion of the echocardiogram was billed correctly.  The patient is receiving a bill for this in the amount of $32.04 for co-ins.  However,  I don't have access to view how the technical portion was billed on the hospital side.  Someone at your office can give the patient the # to hospital billing so they can follow up with them.  The # is [phone] or [phone].  Thanks, Sharyn Lull

## 2018-05-24 NOTE — Telephone Encounter (Signed)
Please let pt know

## 2018-06-17 NOTE — Telephone Encounter (Signed)
Patient notified and acknowledged understanding that it went towards his deductible as it is the beginning of the new calendar year.

## 2018-06-21 ENCOUNTER — Ambulatory Visit: Payer: 59 | Admitting: Internal Medicine

## 2018-06-22 ENCOUNTER — Telehealth: Payer: Self-pay | Admitting: Internal Medicine

## 2018-06-22 MED ORDER — DICYCLOMINE HCL 20 MG PO TABS: 20.0000 mg | ORAL_TABLET | Freq: Three times a day (TID) | ORAL | 0 refills | Status: DC | PRN

## 2018-06-22 NOTE — Telephone Encounter (Signed)
Advise patient, I cannot send Reglan. If he is certain that this was simply a stomach virus and he still have some cramping, we can send Bentyl 20 mg 1 p.o. 3 times daily prn #15 no refills. However if he has fever, chills, severe symptoms or if he is not sure if this was a simple virus, needs office visit

## 2018-06-22 NOTE — Telephone Encounter (Signed)
Copied from [place] [phone]. Topic: Quick Communication - Rx Refill/Question >> Jun 22, 2018  9:51 AM Alanda Slim E wrote: Medication: Pt called in and stated he has had a stomach bug since Monday and is now having a bit of cramping and wants to know if Dr. Larose Kells can call in some Reglan for the cramping/ pleas advise    Preferred Pharmacy (with phone number or street name): [facility] [place], [place] [phone] (Phone) [phone] (Fax)    Agent: Please be advised that RX refills may take up to 3 business days. We ask that you follow-up with your pharmacy.

## 2018-06-22 NOTE — Telephone Encounter (Signed)
Please advise 

## 2018-06-22 NOTE — Telephone Encounter (Signed)
Spoke w/ Pt- he started w/ nausea and vomiting at 3AM on Monday morning, states he never had fever that he knew of, he is feeling better other than intermittent abdominal cramping. Informed Pt that Bentyl has been sent- instructed if the cramping does not get better or he becomes worse in anyway to call office and schedule visit. Pt verbalized understanding.

## 2018-08-31 ENCOUNTER — Ambulatory Visit (INDEPENDENT_AMBULATORY_CARE_PROVIDER_SITE_OTHER): Payer: 59 | Admitting: Internal Medicine

## 2018-08-31 ENCOUNTER — Encounter: Payer: Self-pay | Admitting: Internal Medicine

## 2018-08-31 ENCOUNTER — Other Ambulatory Visit: Payer: Self-pay

## 2018-08-31 DIAGNOSIS — Z09 Encounter for follow-up examination after completed treatment for conditions other than malignant neoplasm: Secondary | ICD-10-CM

## 2018-08-31 DIAGNOSIS — I10 Essential (primary) hypertension: Secondary | ICD-10-CM | POA: Diagnosis not present

## 2018-08-31 NOTE — Progress Notes (Signed)
Subjective:    Patient ID: Derek Holloway, male    DOB: 10-07-1975, 43 y.o.   MRN: [id number]  DOS:  08/31/2018 Type of visit - description: Attempted  to make this a video visit, due to technical difficulties from the patient side it was not possible  thus we proceeded with a Virtual Visit via Telephone    I connected with@ on 08/31/18 at  9:20 AM EDT by telephone and verified that I am speaking with the correct person using two identifiers.  THIS ENCOUNTER IS A VIRTUAL VISIT DUE TO COVID-19 - PATIENT WAS NOT SEEN IN THE OFFICE. PATIENT HAS CONSENTED TO VIRTUAL VISIT / TELEMEDICINE VISIT   Location of patient: home  Location of provider: office  I discussed the limitations, risks, security and privacy concerns of performing an evaluation and management service by telephone and the availability of in person appointments. I also discussed with the patient that there may be a patient responsible charge related to this service. The patient expressed understanding and agreed to proceed.   History of Present Illness: Follow-up HTN: Good compliance with amlodipine, BPs have been very good, the last time was check was 116/70 right arm, 5 points higher on the left. COVID-19: Fortunately, the patient is doing well, he has been provided all the protective equipment from the [employer] he works out.    Review of Systems Denies fever chills. No cough No chest pain, difficulty breathing, no edema. No nausea vomiting or diarrhea  Past Medical History:  Diagnosis Date  . Anxiety 05/27/2014  . Hyperparathyroidism, unspecified ([place])    s/p surgery  . Hypertension   . Kidney stones     Past Surgical History:  Procedure Laterality Date  . KNEE ARTHROSCOPY Left 2012  . PARATHYROIDECTOMY  2003    Social History   Socioeconomic History  . Marital status: Married    Spouse name: Not on file  . Number of children: 2  . Years of education: Not on file  . Highest education  level: Not on file  Occupational History  . Occupation: Engineer, structural- [employer]  . Financial resource strain: Not on file  . Food insecurity:    Worry: Not on file    Inability: Not on file  . Transportation needs:    Medical: Not on file    Non-medical: Not on file  Tobacco Use  . Smoking status: Never Smoker  . Smokeless tobacco: Never Used  Substance and Sexual Activity  . Alcohol use: Yes  . Drug use: No  . Sexual activity: Not on file  Lifestyle  . Physical activity:    Days per week: Not on file    Minutes per session: Not on file  . Stress: Not on file  Relationships  . Social connections:    Talks on phone: Not on file    Gets together: Not on file    Attends religious service: Not on file    Active member of club or organization: Not on file    Attends meetings of clubs or organizations: Not on file    Relationship status: Not on file  . Intimate partner violence:    Fear of current or ex partner: Not on file    Emotionally abused: Not on file    Physically abused: Not on file    Forced sexual activity: Not on file  Other Topics Concern  . Not on file  Social History Narrative   Daughter soccer 2004  Son hockey 2006      Allergies as of 08/31/2018   No Known Allergies     Medication List       Accurate as of August 31, 2018  9:23 AM. Always use your most recent med list.        amLODipine 5 MG tablet Commonly known as:  NORVASC Take 1 tablet (5 mg total) by mouth daily.   dicyclomine 20 MG tablet Commonly known as:  Bentyl Take 1 tablet (20 mg total) by mouth 3 (three) times daily as needed for spasms.   fluticasone 50 MCG/ACT nasal spray Commonly known as:  FLONASE Place 2 sprays into both nostrils daily.   levocetirizine 5 MG tablet Commonly known as:  XYZAL Take 1 tablet (5 mg total) by mouth daily.   montelukast 10 MG tablet Commonly known as:  SINGULAIR Take 1 tablet (10 mg total) by mouth at bedtime.            Objective:   Physical Exam There were no vitals taken for this visit. This is a phone visit, alert oriented x3, no apparent distress    Assessment     Assessment HTN: Restarted meds 03-2017 BP slightly higher on the left arm, normal echo 03-2018. H/o Vitiligo Allergies, sees the specialist H/O hyperparathyroidism, S/P surgery H/O Anxiety depression  PLAN: HTN: Currently on amlodipine 5 mg daily, started 03-2018, no side effects, BPs are normal, slightly higher on the left arm. Patient wonders if this will be a permanent medication, recommended to reassess together the issue every several months.  Currently BP is well controlled, no side effects, continue with amlodipine. COVID-19: Fortunately, he is doing well, using all the protective equipment he needs. RTC CPX 03-2019, my staff will reach out schedule   I discussed the assessment and treatment plan with the patient. The patient was provided an opportunity to ask questions and all were answered. The patient agreed with the plan and demonstrated an understanding of the instructions.   The patient was advised to call back or seek an in-person evaluation if the symptoms worsen or if the condition fails to improve as anticipated.  I provided 15 minutes of non-face-to-face time during this encounter.  Kathlene November, MD

## 2018-09-01 NOTE — Assessment & Plan Note (Signed)
HTN: Currently on amlodipine 5 mg daily, started 03-2018, no side effects, BPs are normal, slightly higher on the left arm. Patient wonders if this will be a permanent medication, recommended to reassess together the issue every several months.  Currently BP is well controlled, no side effects, continue with amlodipine. COVID-19: Fortunately, he is doing well, using all the protective equipment he needs. RTC CPX 03-2019, my staff will reach out schedule

## 2018-10-16 ENCOUNTER — Other Ambulatory Visit: Payer: Self-pay | Admitting: Internal Medicine

## 2019-03-20 ENCOUNTER — Encounter: Payer: Self-pay | Admitting: Internal Medicine

## 2019-03-21 ENCOUNTER — Encounter: Payer: 59 | Admitting: Internal Medicine

## 2019-03-21 ENCOUNTER — Other Ambulatory Visit: Payer: Self-pay

## 2019-03-22 ENCOUNTER — Encounter: Payer: Self-pay | Admitting: Internal Medicine

## 2019-03-22 ENCOUNTER — Ambulatory Visit (INDEPENDENT_AMBULATORY_CARE_PROVIDER_SITE_OTHER): Payer: 59 | Admitting: Internal Medicine

## 2019-03-22 VITALS — BP 148/93 | HR 87 | Temp 97.3°F | Resp 16 | Ht 68.0 in | Wt 188.1 lb

## 2019-03-22 DIAGNOSIS — Z23 Encounter for immunization: Secondary | ICD-10-CM | POA: Diagnosis not present

## 2019-03-22 DIAGNOSIS — Z125 Encounter for screening for malignant neoplasm of prostate: Secondary | ICD-10-CM | POA: Diagnosis not present

## 2019-03-22 DIAGNOSIS — Z0000 Encounter for general adult medical examination without abnormal findings: Secondary | ICD-10-CM

## 2019-03-22 DIAGNOSIS — I10 Essential (primary) hypertension: Secondary | ICD-10-CM

## 2019-03-22 LAB — PSA: PSA: 0.66 ng/mL (ref 0.10–4.00)

## 2019-03-22 LAB — COMPREHENSIVE METABOLIC PANEL
ALT: 13 U/L (ref 0–53)
AST: 17 U/L (ref 0–37)
Albumin: 4.7 g/dL (ref 3.5–5.2)
Alkaline Phosphatase: 45 U/L (ref 39–117)
BUN: 12 mg/dL (ref 6–23)
CO2: 31 mEq/L (ref 19–32)
Calcium: 9.6 mg/dL (ref 8.4–10.5)
Chloride: 102 mEq/L (ref 96–112)
Creatinine, Ser: 1.2 mg/dL (ref 0.40–1.50)
GFR: 65.51 mL/min (ref >60.00)
Glucose, Bld: 90 mg/dL (ref 70–99)
Potassium: 4.4 mEq/L (ref 3.5–5.1)
Sodium: 140 mEq/L (ref 135–145)
Total Bilirubin: 1 mg/dL (ref 0.2–1.2)
Total Protein: 7.5 g/dL (ref 6.0–8.3)

## 2019-03-22 LAB — CBC WITH DIFFERENTIAL/PLATELET
Basophils Absolute: 0.1 10*3/uL (ref 0.0–0.1)
Basophils Relative: 0.7 % (ref 0.0–3.0)
Eosinophils Absolute: 0.3 10*3/uL (ref 0.0–0.7)
Eosinophils Relative: 4.1 % (ref 0.0–5.0)
HCT: 44.6 % (ref 39.0–52.0)
Hemoglobin: 14.9 g/dL (ref 13.0–17.0)
Lymphocytes Relative: 25.3 % (ref 12.0–46.0)
Lymphs Abs: 2 10*3/uL (ref 0.7–4.0)
MCHC: 33.3 g/dL (ref 30.0–36.0)
MCV: 81.2 fl (ref 78.0–100.0)
Monocytes Absolute: 0.7 10*3/uL (ref 0.1–1.0)
Monocytes Relative: 8.2 % (ref 3.0–12.0)
Neutro Abs: 4.9 10*3/uL (ref 1.4–7.7)
Neutrophils Relative %: 61.7 % (ref 43.0–77.0)
Platelets: 245 10*3/uL (ref 150.0–400.0)
RBC: 5.5 Mil/uL (ref 4.22–5.81)
RDW: 14.8 % (ref 11.5–15.5)
WBC: 8 10*3/uL (ref 4.0–10.5)

## 2019-03-22 LAB — LIPID PANEL
Cholesterol: 237 mg/dL — ABNORMAL HIGH (ref 0–200)
HDL: 67.9 mg/dL (ref >39.00)
LDL Cholesterol: 155 mg/dL — ABNORMAL HIGH (ref 0–99)
NonHDL: 168.63
Total CHOL/HDL Ratio: 3
Triglycerides: 67 mg/dL (ref 0.0–149.0)
VLDL: 13.4 mg/dL (ref 0.0–40.0)

## 2019-03-22 NOTE — Patient Instructions (Signed)
GO TO THE LAB : Get the blood work     GO TO THE FRONT DESK Schedule your next appointment   For a physical in 1 year    Check the  blood pressure  Weekly  BP GOAL is between 110/65 and  135/85. If it is consistently higher or lower, let me know   HOW TO TAKE YOUR BLOOD PRESSURE:   Rest 5 minutes before taking your blood pressure.   Don't smoke or drink caffeinated beverages for at least 30 minutes before.   Take your blood pressure before (not after) you eat.   Sit comfortably with your back supported and both feet on the floor (don't cross your legs).   Elevate your arm to heart level on a table or a desk.   Use the proper sized cuff. It should fit smoothly and snugly around your bare upper arm. There should be enough room to slip a fingertip under the cuff. The bottom edge of the cuff should be 1 inch above the crease of the elbow.   Ideally, take 3 measurements at one sitting and record the average.

## 2019-03-22 NOTE — Assessment & Plan Note (Addendum)
-  Td: 2015;   flu shot today -Diet, exercise: Doing well, most of the weeks plays hockey twice. -10-year CV RF 2.1%. -Labs: CMP, FLP, CBC, PSA

## 2019-03-22 NOTE — Progress Notes (Signed)
Subjective:    Patient ID: Derek Holloway, male    DOB: December 22, 1975, 43 y.o.   MRN: [id number]  DOS:  03/22/2019 Type of visit - description: CPX In general doing well. He continues to be somewhat concerned about his discordant blood pressures in the upper extremities   Review of Systems  He remains active trying to eat healthy. To see Ortho this afternoon for left shoulder pain.  Other than above, a 14 point review of systems is negative   Past Medical History:  Diagnosis Date  . Anxiety 05/27/2014  . Hyperparathyroidism, unspecified ([place])    s/p surgery  . Hypertension   . Kidney stones     Past Surgical History:  Procedure Laterality Date  . KNEE ARTHROSCOPY Left 2012  . PARATHYROIDECTOMY  2003    Social History   Socioeconomic History  . Marital status: Married    Spouse name: Not on file  . Number of children: 2  . Years of education: Not on file  . Highest education level: Not on file  Occupational History  . Occupation: Engineer, structural- [employer]  . Financial resource strain: Not on file  . Food insecurity    Worry: Not on file    Inability: Not on file  . Transportation needs    Medical: Not on file    Non-medical: Not on file  Tobacco Use  . Smoking status: Never Smoker  . Smokeless tobacco: Never Used  Substance and Sexual Activity  . Alcohol use: Yes  . Drug use: No  . Sexual activity: Not on file  Lifestyle  . Physical activity    Days per week: Not on file    Minutes per session: Not on file  . Stress: Not on file  Relationships  . Social Herbalist on phone: Not on file    Gets together: Not on file    Attends religious service: Not on file    Active member of club or organization: Not on file    Attends meetings of clubs or organizations: Not on file    Relationship status: Not on file  . Intimate partner violence    Fear of current or ex partner: Not on file    Emotionally abused: Not on file    Physically  abused: Not on file    Forced sexual activity: Not on file  Other Topics Concern  . Not on file  Social History Narrative   Daughter soccer 2004   Son hockey 2006     Family History  Problem Relation Age of Onset  . CAD Other        GM in her 60s  . Hypertension Mother   . Dementia Father        mild at age 52  . Stroke Neg Hx   . Diabetes Neg Hx   . Colon cancer Neg Hx   . Breast cancer Neg Hx   . Prostate cancer Neg Hx      Allergies as of 03/22/2019   No Known Allergies     Medication List       Accurate as of March 22, 2019 11:59 PM. If you have any questions, ask your nurse or doctor.        STOP taking these medications   dicyclomine 20 MG tablet Commonly known as: Bentyl Stopped by: Kathlene November, MD     TAKE these medications   amLODipine 5 MG tablet Commonly known as: NORVASC  Take 1 tablet (5 mg total) by mouth daily.   fluticasone 50 MCG/ACT nasal spray Commonly known as: FLONASE Place 2 sprays into both nostrils daily.   levocetirizine 5 MG tablet Commonly known as: XYZAL Take 1 tablet (5 mg total) by mouth daily.   montelukast 10 MG tablet Commonly known as: SINGULAIR Take 1 tablet (10 mg total) by mouth at bedtime.           Objective:   Physical Exam BP (!) 148/93 (BP Location: Right Arm, Patient Position: Sitting, Cuff Size: Small)   Pulse 87   Temp (!) 97.3 F (36.3 C) (Temporal)   Resp 16   Ht 5\' 8"  (1.727 m)   Wt 188 lb 2 oz (85.3 kg)   SpO2 99%   BMI 28.60 kg/m  General: Well developed, NAD, BMI noted Neck: No  thyromegaly.  Normal carotid pulses HEENT:  Normocephalic . Face symmetric, atraumatic Lungs:  CTA B Normal respiratory effort, no intercostal retractions, no accessory muscle use. Heart: RRR,  no murmur.  No pretibial edema bilaterally Normal radial pulses Abdomen:  Not distended, soft, non-tender. No rebound or rigidity.   Skin: Exposed areas without rash. Not pale. Not jaundice Neurologic:  alert &  oriented X3.  Speech normal, gait appropriate for age and unassisted Strength symmetric and appropriate for age.  Psych: Cognition and judgment appear intact.  Cooperative with normal attention span and concentration.  Behavior appropriate. No anxious or depressed appearing.     Assessment     Assessment HTN: Restarted meds 03-2017 BP slightly higher on the left arm, normal echo 03-2018. H/o Vitiligo Allergies, sees the specialist H/O hyperparathyroidism, S/P surgery H/O Anxiety depression  PLAN: Here for CPX HTN: He checks his blood pressures regularly at home, they are typically higher on the left arm. BP today upon arrival 143/93, I recheck it: Right arm 120/80 Left arm 135/95. Echocardiogram last year showing no aortic coarctation, he has no arm claudication and radial pulses are normal. Patient remains concerned about the BP discordance.  Refer to cardiology following questions: Further evaluation?  What arm should be use for BP goal monitoring? RTC 1 year CPX

## 2019-03-22 NOTE — Progress Notes (Signed)
Pre visit review using our clinic review tool, if applicable. No additional management support is needed unless otherwise documented below in the visit note. 

## 2019-03-23 NOTE — Assessment & Plan Note (Signed)
Here for CPX HTN: He checks his blood pressures regularly at home, they are typically higher on the left arm. BP today upon arrival 143/93, I recheck it: Right arm 120/80 Left arm 135/95. Echocardiogram last year showing no aortic coarctation, he has no arm claudication and radial pulses are normal. Patient remains concerned about the BP discordance.  Refer to cardiology following questions: Further evaluation?  What arm should be use for BP goal monitoring? RTC 1 year CPX

## 2019-03-24 ENCOUNTER — Encounter: Payer: Self-pay | Admitting: Cardiology

## 2019-03-24 ENCOUNTER — Other Ambulatory Visit: Payer: Self-pay

## 2019-03-24 ENCOUNTER — Ambulatory Visit (INDEPENDENT_AMBULATORY_CARE_PROVIDER_SITE_OTHER): Payer: 59 | Admitting: Cardiology

## 2019-03-24 VITALS — BP 132/90 | HR 77 | Ht 68.0 in | Wt 190.0 lb

## 2019-03-24 DIAGNOSIS — E782 Mixed hyperlipidemia: Secondary | ICD-10-CM | POA: Diagnosis not present

## 2019-03-24 DIAGNOSIS — E7800 Pure hypercholesterolemia, unspecified: Secondary | ICD-10-CM

## 2019-03-24 DIAGNOSIS — I10 Essential (primary) hypertension: Secondary | ICD-10-CM

## 2019-03-24 NOTE — Progress Notes (Signed)
Cardiology Office Note:    Date:  03/24/2019   ID:  Derek Holloway, DOB 11/12/1975, MRN [id number]  PCP:  Colon Branch, MD  Cardiologist:  Berniece Salines, DO  Electrophysiologist:  None   Referring MD: Colon Branch, MD   Chief Complaint  Patient presents with  . Hypertension    History of Present Illness:    Derek Holloway is a 43 y.o. male with a hx of hypertension, anxiety resents today to be evaluated for elevated blood pressure.  The patient tells me he was diagnosed several years ago with hypertension which was at his regular doctor's visit.  He does not remember the blood pressure that he had on initial diagnosis however he was started on amlodipine and has been taking this medication since.  He tells me that he is very active and he has been watching his diet and opting for more healthy lifestyle.  His concern that he shared with me today was initially he did have discrepancies on bilateral arm with his blood pressure which he notes that in the beginning when arm would be 20 mmHg higher than the other.  But over time these has seems to be close together with mild discrepancies in 2 to 5 mmHg.  He denies any chest pain, shortness of breath, palpitations.   Past Medical History:  Diagnosis Date  . Anxiety 05/27/2014  . Hyperparathyroidism, unspecified ([place])    s/p surgery  . Hypertension   . Kidney stones     Past Surgical History:  Procedure Laterality Date  . KNEE ARTHROSCOPY Left 2012  . PARATHYROIDECTOMY  2003    Current Medications: Current Meds  Medication Sig  . amLODipine (NORVASC) 5 MG tablet Take 1 tablet (5 mg total) by mouth daily.  . fluticasone (FLONASE) 50 MCG/ACT nasal spray Place 2 sprays into both nostrils daily.  Marland Kitchen levocetirizine (XYZAL) 5 MG tablet Take 1 tablet (5 mg total) by mouth daily.  . montelukast (SINGULAIR) 10 MG tablet Take 1 tablet (10 mg total) by mouth at bedtime.     Allergies:   Patient has no known allergies.   Social  History   Socioeconomic History  . Marital status: Married    Spouse name: Not on file  . Number of children: 2  . Years of education: Not on file  . Highest education level: Not on file  Occupational History  . Occupation: Engineer, structural- [employer]  . Financial resource strain: Not on file  . Food insecurity    Worry: Not on file    Inability: Not on file  . Transportation needs    Medical: Not on file    Non-medical: Not on file  Tobacco Use  . Smoking status: Never Smoker  . Smokeless tobacco: Never Used  Substance and Sexual Activity  . Alcohol use: Yes  . Drug use: No  . Sexual activity: Not on file  Lifestyle  . Physical activity    Days per week: Not on file    Minutes per session: Not on file  . Stress: Not on file  Relationships  . Social Herbalist on phone: Not on file    Gets together: Not on file    Attends religious service: Not on file    Active member of club or organization: Not on file    Attends meetings of clubs or organizations: Not on file    Relationship status: Not on file  Other Topics Concern  .  Not on file  Social History Narrative   Daughter soccer 2004   Son hockey 2006     Family History: The patient's family history includes CAD in an other family member; Dementia in his father; Hypertension in his mother. There is no history of Stroke, Diabetes, Colon cancer, Breast cancer, or Prostate cancer.  ROS:   Review of Systems  Constitution: Negative for decreased appetite, fever and weight gain.  HENT: Negative for congestion, ear discharge, hoarse voice and sore throat.   Eyes: Negative for discharge, redness, vision loss in right eye and visual halos.  Cardiovascular: Negative for chest pain, dyspnea on exertion, leg swelling, orthopnea and palpitations.  Respiratory: Negative for cough, hemoptysis, shortness of breath and snoring.   Endocrine: Negative for heat intolerance and polyphagia.  Hematologic/Lymphatic:  Negative for bleeding problem. Does not bruise/bleed easily.  Skin: Negative for flushing, nail changes, rash and suspicious lesions.  Musculoskeletal: Negative for arthritis, joint pain, muscle cramps, myalgias, neck pain and stiffness.  Gastrointestinal: Negative for abdominal pain, bowel incontinence, diarrhea and excessive appetite.  Genitourinary: Negative for decreased libido, genital sores and incomplete emptying.  Neurological: Negative for brief paralysis, focal weakness, headaches and loss of balance.  Psychiatric/Behavioral: Negative for altered mental status, depression and suicidal ideas.  Allergic/Immunologic: Negative for HIV exposure and persistent infections.    EKGs/Labs/Other Studies Reviewed:    The following studies were reviewed today:   EKG:  The ekg ordered today demonstrates sinus rhythm, heart rate 67 bpm, similar to EKG done on 03/18/2018.  Recent Labs: 03/22/2019: ALT 13; BUN 12; Creatinine, Ser 1.20; Hemoglobin 14.9; Platelets 245.0; Potassium 4.4; Sodium 140  Recent Lipid Panel    Component Value Date/Time   CHOL 237 (H) 03/22/2019 1332   TRIG 67.0 03/22/2019 1332   HDL 67.90 03/22/2019 1332   CHOLHDL 3 03/22/2019 1332   VLDL 13.4 03/22/2019 1332   LDLCALC 155 (H) 03/22/2019 1332   LDLDIRECT 163.6 12/30/2012 0846    Physical Exam:    VS:  BP 132/90 (BP Location: Left Arm, Patient Position: Sitting, Cuff Size: Normal)   Pulse 77   Ht 5\' 8"  (1.727 m)   Wt 190 lb (86.2 kg)   SpO2 99%   BMI 28.89 kg/m     Wt Readings from Last 3 Encounters:  03/24/19 190 lb (86.2 kg)  03/22/19 188 lb 2 oz (85.3 kg)  03/18/18 184 lb 8 oz (83.7 kg)     GEN: Well nourished, well developed in no acute distress HEENT: Normal NECK: No JVD; No carotid bruits LYMPHATICS: No lymphadenopathy CARDIAC: S1S2 noted,RRR, no murmurs, rubs, gallops RESPIRATORY:  Clear to auscultation without rales, wheezing or rhonchi  ABDOMEN: Soft, non-tender, non-distended, +bowel  sounds, no guarding. EXTREMITIES: No edema, No cyanosis, no clubbing MUSCULOSKELETAL:  No edema; No deformity  SKIN: Warm and dry NEUROLOGIC:  Alert and oriented x 3, non-focal PSYCHIATRIC:  Normal affect, good insight  ASSESSMENT:    1. Essential hypertension   2. Mixed hyperlipidemia   3. Hypercholesteremia    PLAN:    1. The patient does have elevated diastolic blood pressure in the office today. He tells me that at home his average blood pressure is usually with systolics in the [id number] and diastolics in the [id number].  He admits to watching his salt intake.  To avoid hypotension in this patient with adding an additional medication or increasing his current medication, I have asked him to record his blood pressure daily and please bring it to his next  office visit.  This will allow me to be able to review his home blood pressure medication and make an appropriate/safe decision in the patient medication management.  Today going to order a transthoracic echocardiogram to assess RV/LV function, any other structural abnormalities.  He also will be ordered a renal ultrasound to rule out any renal artery stenosis.  2.  Hyperlipidemia-I reviewed the patient recent blood work that was done on March 22, 2019.  He has had increase in his lipid profile from 121 (2 years ago) to 155.  He will try lifestyle modification for the next 3 months.  This the profile will be repeated by me in 3 months.  And discussed with the patient that this continues to be elevated he will benefit from statin medications.  3.  Hypercholesterol-cholesterol 237 today previously 196.  Plan as stated above.   the patient is in agreement with the above plan. The patient left the office in stable condition.  The patient will follow up in 3 months  Medication Adjustments/Labs and Tests Ordered: Current medicines are reviewed at length with the patient today.  Concerns regarding medicines are outlined above.  Orders Placed This  Encounter  Procedures  . Lipid Profile  . EKG 12-Lead  . ECHOCARDIOGRAM COMPLETE  . VAS US RENAL ARTERY DUPLEX   No orders of the defined types were placed in this encounter.   Patient Instructions  Medication Instructions:  Your physician recommends that you continue on your current medications as directed. Please refer to the Current Medication list given to you today.  *If you need a refill on your cardiac medications before your next appointment, please call your pharmacy*  Lab Work: Your physician recommends that you return for lab work in:   Before next visit : LIPIDs Fasting  If you have labs (blood work) drawn today and your tests are completely normal, you will receive your results only by: Marland Kitchen MyChart Message (if you have MyChart) OR . A paper copy in the mail If you have any lab test that is abnormal or we need to change your treatment, we will call you to review the results.  Testing/Procedures: Your physician has requested that you have an echocardiogram. Echocardiography is a painless test that uses sound waves to create images of your heart. It provides your doctor with information about the size and shape of your heart and how well your heart's chambers and valves are working. This procedure takes approximately one hour. There are no restrictions for this procedure.  Your physician has requested that you have a renal artery duplex. During this test, an ultrasound is used to evaluate blood flow to the kidneys. Allow one hour for this exam. Do not eat after midnight the day before and avoid carbonated beverages. Take your medications as you usually do.      Follow-Up: At [facility], you and your health needs are our priority.  As part of our continuing mission to provide you with exceptional heart care, we have created designated Provider Care Teams.  These Care Teams include your primary Cardiologist (physician) and Advanced Practice Providers (APPs -  Physician  Assistants and Nurse Practitioners) who all work together to provide you with the care you need, when you need it.  Your next appointment:   3 months  The format for your next appointment:   In Person  Provider:   Berniece Salines, DO  Other Instructions     [facility]beat  Tips to measure your blood pressure correctly  To determine whether you have hypertension, a medical professional will take a blood pressure reading. How you prepare for the test, the position of your arm, and other factors can change a blood pressure reading by 10% or more. That could be enough to hide high blood pressure, start you on a drug you don't really need, or lead your doctor to incorrectly adjust your medications. National and international guidelines offer specific instructions for measuring blood pressure. If a doctor, nurse, or medical assistant isn't doing it right, don't hesitate to ask him or her to get with the guidelines. Here's what you can do to ensure a correct reading: . Don't drink a caffeinated beverage or smoke during the 30 minutes before the test. . Sit quietly for five minutes before the test begins. . During the measurement, sit in a chair with your feet on the floor and your arm supported so your elbow is at about heart level. . The inflatable part of the cuff should completely cover at least 80% of your upper arm, and the cuff should be placed on bare skin, not over a shirt. . Don't talk during the measurement. . Have your blood pressure measured twice, with a brief break in between. If the readings are different by 5 points or more, have it done a third time. There are times to break these rules. If you sometimes feel lightheaded when getting out of bed in the morning or when you stand after sitting, you should have your blood pressure checked while seated and then while standing to see if it falls from one position to the next. Because blood pressure varies throughout the day, your doctor will  rarely diagnose hypertension on the basis of a single reading. Instead, he or she will want to confirm the measurements on at least two occasions, usually within a few weeks of one another. The exception to this rule is if you have a blood pressure reading of 180/110 mm Hg or higher. A result this high usually calls for prompt treatment. It's also a good idea to have your blood pressure measured in both arms at least once, since the reading in one arm (usually the right) may be higher than that in the left. A 2014 study in The American Journal of Medicine of nearly 3,400 people found average arm- to-arm differences in systolic blood pressure of about 5 points. The higher number should be used to make treatment decisions. In 2017, new guidelines from the [employer], the [employer] of Cardiology, and nine other health organizations lowered the diagnosis of high blood pressure to 130/80 mm Hg or higher for all adults. The guidelines also redefined the various blood pressure categories to now include normal, elevated, Stage 1 hypertension, Stage 2 hypertension, and hypertensive crisis (see "Blood pressure categories"). Blood pressure categories  Blood pressure category SYSTOLIC (upper number)  DIASTOLIC (lower number)  Normal Less than 120 mm Hg and Less than 80 mm Hg  Elevated 120-129 mm Hg and Less than 80 mm Hg  High blood pressure: Stage 1 hypertension 130-139 mm Hg or 80-89 mm Hg  High blood pressure: Stage 2 hypertension 140 mm Hg or higher or 90 mm Hg or higher  Hypertensive crisis (consult your doctor immediately) Higher than 180 mm Hg and/or Higher than 120 mm Hg  Source: American Heart Association and American Stroke Association. For more on getting your blood pressure under control, buy Controlling Your Blood Pressure, a Special Health Report from [facility].  Adopting a Healthy Lifestyle.  Know what a healthy weight is for you (roughly BMI <25) and aim to  maintain this   Aim for 7+ servings of fruits and vegetables daily   65-80+ fluid ounces of water or unsweet tea for healthy kidneys   Limit to max 1 drink of alcohol per day; avoid smoking/tobacco   Limit animal fats in diet for cholesterol and heart health - choose grass fed whenever available   Avoid highly processed foods, and foods high in saturated/trans fats   Aim for low stress - take time to unwind and care for your mental health   Aim for 150 min of moderate intensity exercise weekly for heart health, and weights twice weekly for bone health   Aim for 7-9 hours of sleep daily   When it comes to diets, agreement about the perfect plan isnt easy to find, even among the experts. Experts at the [employer] developed an idea known as the Healthy Eating Plate. Just imagine a plate divided into logical, healthy portions.   The emphasis is on diet quality:   Load up on vegetables and fruits - one-half of your plate: Aim for color and variety, and remember that potatoes dont count.   Go for whole grains - one-quarter of your plate: Whole wheat, barley, wheat berries, quinoa, oats, brown rice, and foods made with them. If you want pasta, go with whole wheat pasta.   Protein power - one-quarter of your plate: Fish, chicken, beans, and nuts are all healthy, versatile protein sources. Limit red meat.   The diet, however, does go beyond the plate, offering a few other suggestions.   Use healthy plant oils, such as olive, canola, soy, corn, sunflower and peanut. Check the labels, and avoid partially hydrogenated oil, which have unhealthy trans fats.   If youre thirsty, drink water. Coffee and tea are good in moderation, but skip sugary drinks and limit milk and dairy products to one or two daily servings.   The type of carbohydrate in the diet is more important than the amount. Some sources of carbohydrates, such as vegetables, fruits, whole grains, and beans-are  healthier than others.   Finally, stay active  Signed, Berniece Salines, DO  03/24/2019 11:09 AM    [place]

## 2019-03-24 NOTE — Patient Instructions (Signed)
Medication Instructions:  Your physician recommends that you continue on your current medications as directed. Please refer to the Current Medication list given to you today.  *If you need a refill on your cardiac medications before your next appointment, please call your pharmacy*  Lab Work: Your physician recommends that you return for lab work in:   Before next visit : LIPIDs Fasting  If you have labs (blood work) drawn today and your tests are completely normal, you will receive your results only by: Marland Kitchen MyChart Message (if you have MyChart) OR . A paper copy in the mail If you have any lab test that is abnormal or we need to change your treatment, we will call you to review the results.  Testing/Procedures: Your physician has requested that you have an echocardiogram. Echocardiography is a painless test that uses sound waves to create images of your heart. It provides your doctor with information about the size and shape of your heart and how well your heart's chambers and valves are working. This procedure takes approximately one hour. There are no restrictions for this procedure.  Your physician has requested that you have a renal artery duplex. During this test, an ultrasound is used to evaluate blood flow to the kidneys. Allow one hour for this exam. Do not eat after midnight the day before and avoid carbonated beverages. Take your medications as you usually do.      Follow-Up: At [facility], you and your health needs are our priority.  As part of our continuing mission to provide you with exceptional heart care, we have created designated Provider Care Teams.  These Care Teams include your primary Cardiologist (physician) and Advanced Practice Providers (APPs -  Physician Assistants and Nurse Practitioners) who all work together to provide you with the care you need, when you need it.  Your next appointment:   3 months  The format for your next appointment:   In Person   Provider:   Berniece Salines, DO  Other Instructions

## 2019-03-28 ENCOUNTER — Encounter (HOSPITAL_BASED_OUTPATIENT_CLINIC_OR_DEPARTMENT_OTHER): Payer: Self-pay

## 2019-03-29 ENCOUNTER — Ambulatory Visit (HOSPITAL_BASED_OUTPATIENT_CLINIC_OR_DEPARTMENT_OTHER)
Admission: RE | Admit: 2019-03-29 | Discharge: 2019-03-29 | Disposition: A | Discharge status: Home / Self Care | Payer: 59 | Source: Ambulatory Visit | Attending: Cardiology | Admitting: Cardiology

## 2019-03-29 ENCOUNTER — Other Ambulatory Visit: Payer: Self-pay

## 2019-03-29 DIAGNOSIS — I10 Essential (primary) hypertension: Secondary | ICD-10-CM | POA: Insufficient documentation

## 2019-03-29 NOTE — Progress Notes (Signed)
  Echocardiogram 2D Echocardiogram has been performed.  Cardell Peach 03/29/2019, 9:32 AM

## 2019-03-29 NOTE — Progress Notes (Signed)
Bilateral renal doppler    03/29/19 Cardell Peach RDCS, RVT

## 2019-03-30 ENCOUNTER — Telehealth: Payer: Self-pay | Admitting: *Deleted

## 2019-03-30 ENCOUNTER — Encounter: Payer: Self-pay | Admitting: *Deleted

## 2019-03-30 NOTE — Telephone Encounter (Signed)
Telephone call to patient. Left message that echo and real artery test were both normal and to call with any questions

## 2019-03-30 NOTE — Telephone Encounter (Signed)
-----   Message from Berniece Salines, DO sent at 03/29/2019  9:21 PM EST ----- Normal echo. Please forward copy to pcp.

## 2019-04-03 ENCOUNTER — Other Ambulatory Visit: Payer: Self-pay | Admitting: Internal Medicine

## 2019-06-16 ENCOUNTER — Ambulatory Visit: Payer: 59 | Admitting: Cardiology

## 2019-08-13 ENCOUNTER — Other Ambulatory Visit: Payer: Self-pay | Admitting: Internal Medicine

## 2020-02-26 ENCOUNTER — Telehealth: Payer: Self-pay | Admitting: Internal Medicine

## 2020-02-26 DIAGNOSIS — R739 Hyperglycemia, unspecified: Secondary | ICD-10-CM

## 2020-02-26 DIAGNOSIS — Z0000 Encounter for general adult medical examination without abnormal findings: Secondary | ICD-10-CM

## 2020-02-26 DIAGNOSIS — I10 Essential (primary) hypertension: Secondary | ICD-10-CM

## 2020-02-26 NOTE — Telephone Encounter (Signed)
CallerRonalee Belts Call Back # [phone]   Pt was wondering if he could come in to get labs a day or two prior to CPE on 03/26/2020.Marland Kitchen Please advise   Thank you!

## 2020-02-26 NOTE — Telephone Encounter (Signed)
Arrange for labs few days before CPX: CMP, FLP, CBC, TSH, PSA

## 2020-02-26 NOTE — Telephone Encounter (Signed)
Please advise 

## 2020-02-27 NOTE — Addendum Note (Signed)
Addended byDamita Dunnings D on: 02/27/2020 07:48 AM   Modules accepted: Orders

## 2020-02-27 NOTE — Telephone Encounter (Signed)
lvm to schedule appt.  

## 2020-02-27 NOTE — Telephone Encounter (Signed)
Orders have been placed. Derek Holloway will you call to schedule him a lab appt (fasting) several days prior to his cpx appt please?

## 2020-03-22 ENCOUNTER — Encounter: Payer: 59 | Admitting: Internal Medicine

## 2020-03-26 ENCOUNTER — Encounter: Payer: 59 | Admitting: Internal Medicine

## 2020-04-16 ENCOUNTER — Encounter: Payer: Self-pay | Admitting: Internal Medicine

## 2020-04-16 ENCOUNTER — Ambulatory Visit (INDEPENDENT_AMBULATORY_CARE_PROVIDER_SITE_OTHER): Payer: 59 | Admitting: Internal Medicine

## 2020-04-16 ENCOUNTER — Other Ambulatory Visit: Payer: Self-pay

## 2020-04-16 ENCOUNTER — Other Ambulatory Visit: Payer: Self-pay | Admitting: Internal Medicine

## 2020-04-16 VITALS — BP 146/92 | HR 83 | Temp 98.0°F | Resp 16 | Ht 68.0 in | Wt 198.0 lb

## 2020-04-16 DIAGNOSIS — Z0000 Encounter for general adult medical examination without abnormal findings: Secondary | ICD-10-CM

## 2020-04-16 DIAGNOSIS — Z23 Encounter for immunization: Secondary | ICD-10-CM | POA: Diagnosis not present

## 2020-04-16 DIAGNOSIS — Z125 Encounter for screening for malignant neoplasm of prostate: Secondary | ICD-10-CM | POA: Diagnosis not present

## 2020-04-16 DIAGNOSIS — I10 Essential (primary) hypertension: Secondary | ICD-10-CM

## 2020-04-16 MED ORDER — AMLODIPINE BESYLATE 5 MG PO TABS
5.0000 mg | ORAL_TABLET | Freq: Every day | ORAL | 3 refills | Status: DC
Start: 2020-04-16 — End: 2021-04-17

## 2020-04-16 NOTE — Progress Notes (Signed)
Pre visit review using our clinic review tool, if applicable. No additional management support is needed unless otherwise documented below in the visit note. 

## 2020-04-16 NOTE — Patient Instructions (Signed)
Check the  blood pressure regularly °BP GOAL is between 110/65 and  135/85. °If it is consistently higher or lower, let me know °  ° °GO TO THE LAB : Get the blood work   ° ° °GO TO THE FRONT DESK, PLEASE SCHEDULE YOUR APPOINTMENTS °Come back for   a physical exam in 1 year °

## 2020-04-16 NOTE — Progress Notes (Signed)
Subjective:    Patient ID: Derek Holloway, male    DOB: [birth year], 44 y.o.   MRN: [id number]  DOS:  04/16/2020 Type of visit - description: CPX Since the last office visit he is feeling great. Has no concerns except that has gained some weight.  Wt Readings from Last 3 Encounters:  04/16/20 198 lb (89.8 kg)  03/24/19 190 lb (86.2 kg)  03/22/19 188 lb 2 oz (85.3 kg)   BP Readings from Last 3 Encounters:  04/16/20 (!) 146/92  03/24/19 132/90  03/22/19 (!) 148/93     Review of Systems  Other than above, a 14 point review of systems is negative     Past Medical History:  Diagnosis Date  . Anxiety 05/27/2014  . Hyperparathyroidism, unspecified ([place])    s/p surgery  . Hypertension   . Kidney stones     Past Surgical History:  Procedure Laterality Date  . KNEE ARTHROSCOPY Left 2012  . PARATHYROIDECTOMY  2003    Allergies as of 04/16/2020   No Known Allergies     Medication List       Accurate as of April 16, 2020 11:59 PM. If you have any questions, ask your nurse or doctor.        amLODipine 5 MG tablet Commonly known as: NORVASC Take 1 tablet (5 mg total) by mouth daily.   fluticasone 50 MCG/ACT nasal spray Commonly known as: FLONASE Place 2 sprays into both nostrils daily.   levocetirizine 5 MG tablet Commonly known as: XYZAL Take 1 tablet (5 mg total) by mouth every evening.   montelukast 10 MG tablet Commonly known as: SINGULAIR Take 1 tablet (10 mg total) by mouth at bedtime.          Objective:   Physical Exam BP (!) 146/92 (BP Location: Right Arm, Patient Position: Sitting, Cuff Size: Normal)   Pulse 83   Temp 98 F (36.7 C) (Oral)   Resp 16   Ht 5\' 8"  (1.727 m)   Wt 198 lb (89.8 kg)   SpO2 100%   BMI 30.11 kg/m  General: Well developed, NAD, BMI noted Neck: No  thyromegaly  HEENT:  Normocephalic . Face symmetric, atraumatic Lungs:  CTA B Normal respiratory effort, no intercostal retractions, no accessory muscle  use. Heart: RRR,  no murmur.  Abdomen:  Not distended, soft, non-tender. No rebound or rigidity.   Lower extremities: no pretibial edema bilaterally  Skin: Exposed areas without rash. Not pale. Not jaundice Neurologic:  alert & oriented X3.  Speech normal, gait appropriate for age and unassisted Strength symmetric and appropriate for age.  Psych: Cognition and judgment appear intact.  Cooperative with normal attention span and concentration.  Behavior appropriate. No anxious or depressed appearing.     Assessment     Assessment HTN:  Restarted meds 03-2017 BP slightly higher on the left arm, normal echo 03-2018. Saw cardiology 03-2019, echo, renal ultrasound normal. H/o Vitiligo Allergies, sees the specialist H/O hyperparathyroidism, S/P surgery CMP, FLP, CBC, H/O Anxiety depression  PLAN: Here for CPX HTN:  Since the last visit had a normal renal U/S and echo after he saw cardiology. On amlodipine, ambulatory BPs in both arms are consistently normal,  ranging from 120-130/70-80 Plan: Continue present care, reassess yearly. Allergies: On antihistaminics daily,occasionally uses singular RTC 1 year     This visit occurred during the SARS-CoV-2 public health emergency.  Safety protocols were in place, including screening questions prior to the visit, additional usage of staff  PPE, and extensive cleaning of exam room while observing appropriate contact time as indicated for disinfecting solutions.

## 2020-04-17 ENCOUNTER — Encounter: Payer: Self-pay | Admitting: Internal Medicine

## 2020-04-17 LAB — COMPREHENSIVE METABOLIC PANEL
ALT: 14 U/L (ref 0–53)
AST: 21 U/L (ref 0–37)
Albumin: 4.7 g/dL (ref 3.5–5.2)
Alkaline Phosphatase: 41 U/L (ref 39–117)
BUN: 16 mg/dL (ref 6–23)
CO2: 31 mEq/L (ref 19–32)
Calcium: 9.7 mg/dL (ref 8.4–10.5)
Chloride: 102 mEq/L (ref 96–112)
Creatinine, Ser: 1.21 mg/dL (ref 0.40–1.50)
GFR: 72.08 mL/min (ref >60.00)
Glucose, Bld: 77 mg/dL (ref 70–99)
Potassium: 3.9 mEq/L (ref 3.5–5.1)
Sodium: 142 mEq/L (ref 135–145)
Total Bilirubin: 1.2 mg/dL (ref 0.2–1.2)
Total Protein: 7.6 g/dL (ref 6.0–8.3)

## 2020-04-17 LAB — CBC WITH DIFFERENTIAL/PLATELET
Basophils Absolute: 0.1 10*3/uL (ref 0.0–0.1)
Basophils Relative: 1 % (ref 0.0–3.0)
Eosinophils Absolute: 0.5 10*3/uL (ref 0.0–0.7)
Eosinophils Relative: 6.4 % — ABNORMAL HIGH (ref 0.0–5.0)
HCT: 44.7 % (ref 39.0–52.0)
Hemoglobin: 14.9 g/dL (ref 13.0–17.0)
Lymphocytes Relative: 27.8 % (ref 12.0–46.0)
Lymphs Abs: 2.2 10*3/uL (ref 0.7–4.0)
MCHC: 33.2 g/dL (ref 30.0–36.0)
MCV: 81.4 fl (ref 78.0–100.0)
Monocytes Absolute: 0.7 10*3/uL (ref 0.1–1.0)
Monocytes Relative: 9.2 % (ref 3.0–12.0)
Neutro Abs: 4.5 10*3/uL (ref 1.4–7.7)
Neutrophils Relative %: 55.6 % (ref 43.0–77.0)
Platelets: 245 10*3/uL (ref 150.0–400.0)
RBC: 5.49 Mil/uL (ref 4.22–5.81)
RDW: 14.3 % (ref 11.5–15.5)
WBC: 8.1 10*3/uL (ref 4.0–10.5)

## 2020-04-17 LAB — LIPID PANEL
Cholesterol: 232 mg/dL — ABNORMAL HIGH (ref 0–200)
HDL: 72.8 mg/dL (ref >39.00)
LDL Cholesterol: 146 mg/dL — ABNORMAL HIGH (ref 0–99)
NonHDL: 159.55
Total CHOL/HDL Ratio: 3
Triglycerides: 66 mg/dL (ref 0.0–149.0)
VLDL: 13.2 mg/dL (ref 0.0–40.0)

## 2020-04-17 LAB — PSA: PSA: 0.62 ng/mL (ref 0.10–4.00)

## 2020-04-17 LAB — TSH: TSH: 1.52 u[IU]/mL (ref 0.35–4.50)

## 2020-04-18 ENCOUNTER — Encounter: Payer: Self-pay | Admitting: Internal Medicine

## 2020-04-18 NOTE — Assessment & Plan Note (Signed)
Here for CPX HTN:  Since the last visit had a normal renal U/S and echo after he saw cardiology. On amlodipine, ambulatory BPs in both arms are consistently normal,  ranging from 120-130/70-80 Plan: Continue present care, reassess yearly. Allergies: On antihistaminics daily,occasionally uses singular RTC 1 year

## 2020-04-18 NOTE — Assessment & Plan Note (Signed)
-  Td: 2015 -Had COVID vaccines x2, rec booster -  flu shot today -CCS: Never had a colonoscopy, 3 options discussed, elected I fob -Diet, exercise: Has gained weight lately but plans to go back on a healthy lifestyle -Labs: CMP, FLP, CBC, TSH, PSA.

## 2021-04-17 ENCOUNTER — Ambulatory Visit (INDEPENDENT_AMBULATORY_CARE_PROVIDER_SITE_OTHER): Payer: 59 | Admitting: Internal Medicine

## 2021-04-17 ENCOUNTER — Encounter: Payer: Self-pay | Admitting: Internal Medicine

## 2021-04-17 VITALS — BP 144/92 | HR 103 | Temp 98.0°F | Resp 16 | Ht 68.0 in | Wt 195.1 lb

## 2021-04-17 DIAGNOSIS — I10 Essential (primary) hypertension: Secondary | ICD-10-CM | POA: Diagnosis not present

## 2021-04-17 DIAGNOSIS — Z1159 Encounter for screening for other viral diseases: Secondary | ICD-10-CM

## 2021-04-17 DIAGNOSIS — Z0000 Encounter for general adult medical examination without abnormal findings: Secondary | ICD-10-CM | POA: Diagnosis not present

## 2021-04-17 DIAGNOSIS — Z23 Encounter for immunization: Secondary | ICD-10-CM

## 2021-04-17 LAB — LIPID PANEL
Cholesterol: 221 mg/dL — ABNORMAL HIGH (ref 0–200)
HDL: 70.4 mg/dL (ref >39.00)
LDL Cholesterol: 138 mg/dL — ABNORMAL HIGH (ref 0–99)
NonHDL: 150.57
Total CHOL/HDL Ratio: 3
Triglycerides: 64 mg/dL (ref 0.0–149.0)
VLDL: 12.8 mg/dL (ref 0.0–40.0)

## 2021-04-17 LAB — HEPATITIS C ANTIBODY
Hepatitis C Ab: NONREACTIVE
SIGNAL TO CUT-OFF: <0.02 (ref <1.00)

## 2021-04-17 LAB — CBC WITH DIFFERENTIAL/PLATELET
Basophils Absolute: 0 10*3/uL (ref 0.0–0.1)
Basophils Relative: 0.9 % (ref 0.0–3.0)
Eosinophils Absolute: 0.4 10*3/uL (ref 0.0–0.7)
Eosinophils Relative: 9.3 % — ABNORMAL HIGH (ref 0.0–5.0)
HCT: 44.7 % (ref 39.0–52.0)
Hemoglobin: 14.9 g/dL (ref 13.0–17.0)
Lymphocytes Relative: 28 % (ref 12.0–46.0)
Lymphs Abs: 1.4 10*3/uL (ref 0.7–4.0)
MCHC: 33.4 g/dL (ref 30.0–36.0)
MCV: 80.8 fl (ref 78.0–100.0)
Monocytes Absolute: 0.4 10*3/uL (ref 0.1–1.0)
Monocytes Relative: 8.4 % (ref 3.0–12.0)
Neutro Abs: 2.6 10*3/uL (ref 1.4–7.7)
Neutrophils Relative %: 53.4 % (ref 43.0–77.0)
Platelets: 233 10*3/uL (ref 150.0–400.0)
RBC: 5.53 Mil/uL (ref 4.22–5.81)
RDW: 14.7 % (ref 11.5–15.5)
WBC: 4.8 10*3/uL (ref 4.0–10.5)

## 2021-04-17 LAB — COMPREHENSIVE METABOLIC PANEL
ALT: 13 U/L (ref 0–53)
AST: 18 U/L (ref 0–37)
Albumin: 4.3 g/dL (ref 3.5–5.2)
Alkaline Phosphatase: 42 U/L (ref 39–117)
BUN: 13 mg/dL (ref 6–23)
CO2: 31 mEq/L (ref 19–32)
Calcium: 9.9 mg/dL (ref 8.4–10.5)
Chloride: 102 mEq/L (ref 96–112)
Creatinine, Ser: 1.29 mg/dL (ref 0.40–1.50)
GFR: 66.28 mL/min (ref >60.00)
Glucose, Bld: 90 mg/dL (ref 70–99)
Potassium: 4 mEq/L (ref 3.5–5.1)
Sodium: 140 mEq/L (ref 135–145)
Total Bilirubin: 1 mg/dL (ref 0.2–1.2)
Total Protein: 7.2 g/dL (ref 6.0–8.3)

## 2021-04-17 LAB — PSA: PSA: 0.66 ng/mL (ref 0.10–4.00)

## 2021-04-17 MED ORDER — LEVOCETIRIZINE DIHYDROCHLORIDE 5 MG PO TABS: 5.0000 mg | ORAL_TABLET | Freq: Every evening | ORAL | 3 refills | Status: DC

## 2021-04-17 MED ORDER — AMLODIPINE BESYLATE 5 MG PO TABS: 5.0000 mg | ORAL_TABLET | Freq: Every day | ORAL | 3 refills | Status: DC

## 2021-04-17 NOTE — Patient Instructions (Addendum)
Check the  blood pressure twice a month BP GOAL is between 110/65 and  135/85. If it is consistently higher or lower, let me know     GO TO THE LAB : Get the blood work     [place], [place] back for   a check up in 6 months
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
## 2021-04-18 ENCOUNTER — Encounter: Payer: Self-pay | Admitting: Internal Medicine

## 2021-04-18 NOTE — Addendum Note (Signed)
Addended byDamita Dunnings D on: 04/18/2021 07:47 AM   Modules accepted: Orders

## 2021-04-22 ENCOUNTER — Other Ambulatory Visit (INDEPENDENT_AMBULATORY_CARE_PROVIDER_SITE_OTHER): Payer: 59

## 2021-04-22 DIAGNOSIS — Z0000 Encounter for general adult medical examination without abnormal findings: Secondary | ICD-10-CM | POA: Diagnosis not present

## 2021-04-22 LAB — FECAL OCCULT BLOOD, IMMUNOCHEMICAL: Fecal Occult Bld: NEGATIVE

## 2021-05-01 ENCOUNTER — Other Ambulatory Visit: Payer: Self-pay | Admitting: Internal Medicine

## 2021-07-10 ENCOUNTER — Encounter: Payer: Self-pay | Admitting: Internal Medicine

## 2022-01-07 ENCOUNTER — Encounter: Payer: Self-pay | Admitting: Internal Medicine

## 2022-01-09 ENCOUNTER — Telehealth: Payer: Self-pay

## 2022-01-09 DIAGNOSIS — R739 Hyperglycemia, unspecified: Secondary | ICD-10-CM

## 2022-01-09 DIAGNOSIS — I10 Essential (primary) hypertension: Secondary | ICD-10-CM

## 2022-01-09 DIAGNOSIS — Z0000 Encounter for general adult medical examination without abnormal findings: Secondary | ICD-10-CM

## 2022-01-09 DIAGNOSIS — D721 Eosinophilia, unspecified: Secondary | ICD-10-CM

## 2022-01-09 NOTE — Telephone Encounter (Signed)
Pt is requesting labs prior to cpx. Please advise?

## 2022-01-13 NOTE — Telephone Encounter (Signed)
Juanda Crumble- orders have been placed, will you contact Pt to schedule lab appt several days prior to his CPX in December. Thank you.

## 2022-01-13 NOTE — Telephone Encounter (Signed)
Schedule labs no sooner than 10 days before CPX. CMP, FLP, CBC, blood smear (DX increase eosinophils), TSH, PSA.

## 2022-04-15 ENCOUNTER — Other Ambulatory Visit: Payer: Self-pay

## 2022-04-15 MED ORDER — AMLODIPINE BESYLATE 5 MG PO TABS: 5.0000 mg | ORAL_TABLET | Freq: Every day | ORAL | 0 refills | Status: DC

## 2022-04-21 ENCOUNTER — Encounter: Payer: 59 | Admitting: Internal Medicine

## 2022-05-15 ENCOUNTER — Other Ambulatory Visit (INDEPENDENT_AMBULATORY_CARE_PROVIDER_SITE_OTHER): Payer: 59

## 2022-05-15 DIAGNOSIS — R739 Hyperglycemia, unspecified: Secondary | ICD-10-CM

## 2022-05-15 DIAGNOSIS — D721 Eosinophilia, unspecified: Secondary | ICD-10-CM

## 2022-05-15 DIAGNOSIS — Z0000 Encounter for general adult medical examination without abnormal findings: Secondary | ICD-10-CM | POA: Diagnosis not present

## 2022-05-15 DIAGNOSIS — I10 Essential (primary) hypertension: Secondary | ICD-10-CM

## 2022-05-18 LAB — COMPREHENSIVE METABOLIC PANEL
AG Ratio: 1.4 (calc) (ref 1.0–2.5)
ALT: 13 U/L (ref 9–46)
AST: 17 U/L (ref 10–40)
Albumin: 4.3 g/dL (ref 3.6–5.1)
Alkaline phosphatase (APISO): 42 U/L (ref 36–130)
BUN/Creatinine Ratio: 15 (calc) (ref 6–22)
BUN: 20 mg/dL (ref 7–25)
CO2: 29 mmol/L (ref 20–32)
Calcium: 9.6 mg/dL (ref 8.6–10.3)
Chloride: 103 mmol/L (ref 98–110)
Creat: 1.36 mg/dL — ABNORMAL HIGH (ref 0.60–1.29)
Globulin: 3.1 g/dL (calc) (ref 1.9–3.7)
Glucose, Bld: 93 mg/dL (ref 65–99)
Potassium: 4.1 mmol/L (ref 3.5–5.3)
Sodium: 140 mmol/L (ref 135–146)
Total Bilirubin: 1.2 mg/dL (ref 0.2–1.2)
Total Protein: 7.4 g/dL (ref 6.1–8.1)

## 2022-05-18 LAB — CBC WITH DIFFERENTIAL/PLATELET
Absolute Monocytes: 555 cells/uL (ref 200–950)
Basophils Absolute: 59 cells/uL (ref 0–200)
Basophils Relative: 1 %
Eosinophils Absolute: 561 cells/uL — ABNORMAL HIGH (ref 15–500)
Eosinophils Relative: 9.5 %
HCT: 44.7 % (ref 38.5–50.0)
Hemoglobin: 15.1 g/dL (ref 13.2–17.1)
Lymphs Abs: 1959 cells/uL (ref 850–3900)
MCH: 27.6 pg (ref 27.0–33.0)
MCHC: 33.8 g/dL (ref 32.0–36.0)
MCV: 81.7 fL (ref 80.0–100.0)
MPV: 9.6 fL (ref 7.5–12.5)
Monocytes Relative: 9.4 %
Neutro Abs: 2767 cells/uL (ref 1500–7800)
Neutrophils Relative %: 46.9 %
Platelets: 243 10*3/uL (ref 140–400)
RBC: 5.47 10*6/uL (ref 4.20–5.80)
RDW: 13.6 % (ref 11.0–15.0)
Total Lymphocyte: 33.2 %
WBC: 5.9 10*3/uL (ref 3.8–10.8)

## 2022-05-18 LAB — LIPID PANEL
Cholesterol: 237 mg/dL — ABNORMAL HIGH (ref <200)
HDL: 73 mg/dL (ref >=40)
LDL Cholesterol (Calc): 148 mg/dL (calc) — ABNORMAL HIGH
Non-HDL Cholesterol (Calc): 164 mg/dL (calc) — ABNORMAL HIGH (ref <130)
Total CHOL/HDL Ratio: 3.2 (calc) (ref <5.0)
Triglycerides: 69 mg/dL (ref <150)

## 2022-05-18 LAB — TSH: TSH: 1.54 mIU/L (ref 0.40–4.50)

## 2022-05-18 LAB — PATHOLOGIST SMEAR REVIEW

## 2022-05-18 LAB — PSA: PSA: 0.55 ng/mL (ref <=4.00)

## 2022-05-19 ENCOUNTER — Ambulatory Visit (INDEPENDENT_AMBULATORY_CARE_PROVIDER_SITE_OTHER): Payer: 59 | Admitting: Internal Medicine

## 2022-05-19 ENCOUNTER — Encounter: Payer: Self-pay | Admitting: Internal Medicine

## 2022-05-19 VITALS — BP 138/86 | HR 88 | Temp 98.2°F | Resp 16 | Ht 68.0 in | Wt 198.4 lb

## 2022-05-19 DIAGNOSIS — R7989 Other specified abnormal findings of blood chemistry: Secondary | ICD-10-CM | POA: Diagnosis not present

## 2022-05-19 DIAGNOSIS — Z0000 Encounter for general adult medical examination without abnormal findings: Secondary | ICD-10-CM | POA: Diagnosis not present

## 2022-05-19 DIAGNOSIS — Z09 Encounter for follow-up examination after completed treatment for conditions other than malignant neoplasm: Secondary | ICD-10-CM

## 2022-05-19 MED ORDER — FEXOFENADINE HCL 60 MG PO TABS: 60.0000 mg | ORAL_TABLET | Freq: Two times a day (BID) | ORAL | 12 refills | Status: DC

## 2022-05-19 NOTE — Patient Instructions (Addendum)
Return the stool test at your earliest convenience  Vaccines I recommend:  Covid booster Flu shot  Change allergy medications to Allegra 60 mg 1 tablet twice daily  Use Flonase 2 sprays on each side of the nose every day  If you are not gradually better let me know  Check the  blood pressure regularly BP GOAL is between 110/65 and  135/85. If it is consistently higher or lower, let me know   [place], [place] back for blood work in 6 months to repeat urine eosinophils  Come back for a physical exam in 1 year

## 2022-05-19 NOTE — Assessment & Plan Note (Signed)
Here for CPX HTN: On amlodipine, ambulatory pieces are very good (133/79, 126/84, 120/77).  No change. Allergies: Has been on Xyzal for a long time, symptoms are not as well-controlled as before, CBC showed eosinophilia. Plan: Switch to Allegra, Rx sent.  Consistent use of Flonase, call if not better. CBC, pathology smear: Eosinophilia noted, also smear shows "RBCs appear to be microcytic and hypochromic on smear review. Suggest evaluation for iron deficiency, if clinically indicated"  He has no anemia and no reason to believe he has iron deficiency.   His parents are of [place] origin and he was told they have something similar (thalassemia?). Plan: Recheck CBC, iron, ferritin (for completeness) in 6 months RTC 6 months labs RTC CPX 1 year
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
## 2022-05-19 NOTE — Assessment & Plan Note (Signed)
-  Td: 2015 - COVID and flu vax vaccines: Benefits discussed, declined  -CCS: Never had a colonoscopy, fecal stool test negative last year.  3 options discussed, elected I fob again this year. -Diet, exercise: Doing well. -Labs reviewed  - Cholesterol has increased slightly, 10-year CV RF still low at 3.2%.  Encouraged to continue w/ a healthy lifestyle.

## 2022-06-08 ENCOUNTER — Other Ambulatory Visit (INDEPENDENT_AMBULATORY_CARE_PROVIDER_SITE_OTHER): Payer: 59

## 2022-06-08 DIAGNOSIS — Z0000 Encounter for general adult medical examination without abnormal findings: Secondary | ICD-10-CM

## 2022-06-09 LAB — FECAL OCCULT BLOOD, IMMUNOCHEMICAL: Fecal Occult Bld: NEGATIVE

## 2022-06-10 ENCOUNTER — Ambulatory Visit: Payer: 59 | Admitting: Internal Medicine

## 2022-06-10 ENCOUNTER — Telehealth: Payer: 59

## 2022-06-10 ENCOUNTER — Encounter: Payer: Self-pay | Admitting: Internal Medicine

## 2022-06-10 VITALS — BP 124/82 | HR 88 | Temp 98.2°F | Resp 16 | Ht 68.0 in | Wt 198.2 lb

## 2022-06-10 DIAGNOSIS — J069 Acute upper respiratory infection, unspecified: Secondary | ICD-10-CM

## 2022-06-10 DIAGNOSIS — B349 Viral infection, unspecified: Secondary | ICD-10-CM

## 2022-06-10 LAB — POC COVID19 BINAXNOW: SARS Coronavirus 2 Ag: NEGATIVE
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
## 2022-06-10 NOTE — Patient Instructions (Signed)
Test for covid tomorrow    Rest, fluids , tylenol  For cough:  Take Mucinex DM or Robitussin-DM OTC.  Follow the instructions in the box.   For nasal congestion: -Use over-the-counter Flonase: 2 nasal sprays on each side of the nose in the morning until you feel better    Call if not gradually better over the next  10 days   Call anytime if the symptoms are severe, you have high fever, short of breath, chest pain

## 2022-06-11 NOTE — Assessment & Plan Note (Signed)
URI: Symptoms started 4 days ago, as described above, today reports this his best day. COVID test here today: Negative. Suspect symptoms are related to a URI, recommend supportive treatment and check a COVID test at home tomorrow.  Although he is not a candidate for Paxlovid, that will help him decide if he needs to isolate himself. See AVS.

## 2022-08-12 ENCOUNTER — Other Ambulatory Visit: Payer: Self-pay

## 2022-08-12 MED ORDER — AMLODIPINE BESYLATE 5 MG PO TABS: 5.0000 mg | ORAL_TABLET | Freq: Every day | ORAL | 3 refills | Status: DC

## 2022-10-19 ENCOUNTER — Other Ambulatory Visit (INDEPENDENT_AMBULATORY_CARE_PROVIDER_SITE_OTHER): Payer: 59

## 2022-10-19 DIAGNOSIS — R7989 Other specified abnormal findings of blood chemistry: Secondary | ICD-10-CM

## 2022-10-19 LAB — CBC WITH DIFFERENTIAL/PLATELET
Basophils Absolute: 0 10*3/uL (ref 0.0–0.1)
Basophils Relative: 0.8 % (ref 0.0–3.0)
Eosinophils Absolute: 0.6 10*3/uL (ref 0.0–0.7)
Eosinophils Relative: 9.1 % — ABNORMAL HIGH (ref 0.0–5.0)
HCT: 45.4 % (ref 39.0–52.0)
Hemoglobin: 14.9 g/dL (ref 13.0–17.0)
Lymphocytes Relative: 29.6 % (ref 12.0–46.0)
Lymphs Abs: 1.8 10*3/uL (ref 0.7–4.0)
MCHC: 33 g/dL (ref 30.0–36.0)
MCV: 81.4 fl (ref 78.0–100.0)
Monocytes Absolute: 0.5 10*3/uL (ref 0.1–1.0)
Monocytes Relative: 8.6 % (ref 3.0–12.0)
Neutro Abs: 3.2 10*3/uL (ref 1.4–7.7)
Neutrophils Relative %: 51.9 % (ref 43.0–77.0)
Platelets: 264 10*3/uL (ref 150.0–400.0)
RBC: 5.57 Mil/uL (ref 4.22–5.81)
RDW: 14.8 % (ref 11.5–15.5)
WBC: 6.2 10*3/uL (ref 4.0–10.5)

## 2022-10-19 LAB — IRON: Iron: 144 ug/dL (ref 42–165)

## 2022-10-19 LAB — FERRITIN: Ferritin: 180.1 ng/mL (ref 22.0–322.0)

## 2022-11-17 ENCOUNTER — Other Ambulatory Visit: Payer: 59

## 2023-03-01 ENCOUNTER — Other Ambulatory Visit: Payer: Self-pay

## 2023-03-01 MED ORDER — LEVOCETIRIZINE DIHYDROCHLORIDE 5 MG PO TABS: 5.0000 mg | ORAL_TABLET | Freq: Every evening | ORAL | 3 refills | Status: DC

## 2023-05-24 ENCOUNTER — Encounter: Payer: 59 | Admitting: Internal Medicine

## 2023-06-02 ENCOUNTER — Telehealth: Payer: Self-pay

## 2023-06-02 DIAGNOSIS — R739 Hyperglycemia, unspecified: Secondary | ICD-10-CM

## 2023-06-02 DIAGNOSIS — I10 Essential (primary) hypertension: Secondary | ICD-10-CM

## 2023-06-02 DIAGNOSIS — Z0000 Encounter for general adult medical examination without abnormal findings: Secondary | ICD-10-CM

## 2023-06-02 NOTE — Telephone Encounter (Signed)
Spoke w/ Pt- lab appt scheduled.

## 2023-06-02 NOTE — Telephone Encounter (Signed)
CMP, FLP CBC PSA

## 2023-06-02 NOTE — Telephone Encounter (Signed)
Copied from CRM [id number]. Topic: Clinical - Request for Lab/Test Order >> Jun 02, 2023  9:03 AM Derek Holloway wrote: Reason for CRM: Patient is requesting lab orders to be placed, he wants to have his labs completed early in the morning before his physical (physical scheduled for 2:20pm). Patient is requesting Holloway call back once labs are in.

## 2023-06-02 NOTE — Telephone Encounter (Signed)
Please advise 

## 2023-06-14 ENCOUNTER — Other Ambulatory Visit (INDEPENDENT_AMBULATORY_CARE_PROVIDER_SITE_OTHER): Payer: 59

## 2023-06-14 DIAGNOSIS — I10 Essential (primary) hypertension: Secondary | ICD-10-CM

## 2023-06-14 DIAGNOSIS — Z0000 Encounter for general adult medical examination without abnormal findings: Secondary | ICD-10-CM

## 2023-06-14 LAB — COMPREHENSIVE METABOLIC PANEL
ALT: 12 U/L (ref 0–53)
AST: 15 U/L (ref 0–37)
Albumin: 4.3 g/dL (ref 3.5–5.2)
Alkaline Phosphatase: 37 U/L — ABNORMAL LOW (ref 39–117)
BUN: 17 mg/dL (ref 6–23)
CO2: 30 meq/L (ref 19–32)
Calcium: 9.5 mg/dL (ref 8.4–10.5)
Chloride: 104 meq/L (ref 96–112)
Creatinine, Ser: 1.28 mg/dL (ref 0.40–1.50)
GFR: 65.9 mL/min (ref >60.00)
Glucose, Bld: 90 mg/dL (ref 70–99)
Potassium: 4.1 meq/L (ref 3.5–5.1)
Sodium: 142 meq/L (ref 135–145)
Total Bilirubin: 1 mg/dL (ref 0.2–1.2)
Total Protein: 6.8 g/dL (ref 6.0–8.3)

## 2023-06-14 LAB — CBC WITH DIFFERENTIAL/PLATELET
Basophils Absolute: 0.1 10*3/uL (ref 0.0–0.1)
Basophils Relative: 1.2 % (ref 0.0–3.0)
Eosinophils Absolute: 0.6 10*3/uL (ref 0.0–0.7)
Eosinophils Relative: 10.7 % — ABNORMAL HIGH (ref 0.0–5.0)
HCT: 43.9 % (ref 39.0–52.0)
Hemoglobin: 14.9 g/dL (ref 13.0–17.0)
Lymphocytes Relative: 34 % (ref 12.0–46.0)
Lymphs Abs: 1.9 10*3/uL (ref 0.7–4.0)
MCHC: 33.9 g/dL (ref 30.0–36.0)
MCV: 81.5 fL (ref 78.0–100.0)
Monocytes Absolute: 0.5 10*3/uL (ref 0.1–1.0)
Monocytes Relative: 8.7 % (ref 3.0–12.0)
Neutro Abs: 2.5 10*3/uL (ref 1.4–7.7)
Neutrophils Relative %: 45.4 % (ref 43.0–77.0)
Platelets: 240 10*3/uL (ref 150.0–400.0)
RBC: 5.38 Mil/uL (ref 4.22–5.81)
RDW: 14.8 % (ref 11.5–15.5)
WBC: 5.5 10*3/uL (ref 4.0–10.5)

## 2023-06-14 LAB — LIPID PANEL
Cholesterol: 210 mg/dL — ABNORMAL HIGH (ref 0–200)
HDL: 63.2 mg/dL (ref >39.00)
LDL Cholesterol: 136 mg/dL — ABNORMAL HIGH (ref 0–99)
NonHDL: 147.26
Total CHOL/HDL Ratio: 3
Triglycerides: 57 mg/dL (ref 0.0–149.0)
VLDL: 11.4 mg/dL (ref 0.0–40.0)

## 2023-06-14 LAB — PSA: PSA: 0.98 ng/mL (ref 0.10–4.00)

## 2023-06-16 ENCOUNTER — Encounter: Payer: Self-pay | Admitting: Internal Medicine

## 2023-06-16 ENCOUNTER — Ambulatory Visit (INDEPENDENT_AMBULATORY_CARE_PROVIDER_SITE_OTHER): Payer: 59 | Admitting: Internal Medicine

## 2023-06-16 VITALS — BP 136/84 | HR 87 | Temp 97.9°F | Ht 68.0 in | Wt 200.0 lb

## 2023-06-16 DIAGNOSIS — Z0000 Encounter for general adult medical examination without abnormal findings: Secondary | ICD-10-CM

## 2023-06-16 DIAGNOSIS — Z1211 Encounter for screening for malignant neoplasm of colon: Secondary | ICD-10-CM
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
## 2023-06-16 NOTE — Patient Instructions (Addendum)
  Check the  blood pressure regularly Blood pressure goal:  between 110/65 and  135/85. If it is consistently higher or lower, let me know    Return the stool card at your convenience    Next visit with me in 1 year     Please schedule it at the front desk

## 2023-06-17 ENCOUNTER — Other Ambulatory Visit (INDEPENDENT_AMBULATORY_CARE_PROVIDER_SITE_OTHER): Payer: 59

## 2023-06-17 ENCOUNTER — Encounter: Payer: Self-pay | Admitting: Internal Medicine

## 2023-06-17 DIAGNOSIS — Z1211 Encounter for screening for malignant neoplasm of colon: Secondary | ICD-10-CM | POA: Diagnosis not present

## 2023-06-17 LAB — FECAL OCCULT BLOOD, IMMUNOCHEMICAL: Fecal Occult Bld: NEGATIVE

## 2023-06-17 NOTE — Assessment & Plan Note (Signed)
 Here for CPX - Td: 2015  - Vaccines I recommend: Tdap, flu shot, COVID booster. -CCS: Never had a colonoscopy,   3 options discussed, elected I fob again this year but plan is to proceed w/ a cscope at age 48 -Diet, exercise: Remains active, eating a very healthy year.. -Labs reviewed, all okay.

## 2023-06-17 NOTE — Progress Notes (Signed)
 Specimen drop off

## 2023-06-17 NOTE — Assessment & Plan Note (Signed)
 Here for CPX HTN: On amlodipine  5 mg, ambulatory BPs in the low 130s/70 or 80.  No change. CV risk calculated today: 3.4%. RTC 1 year.

## 2023-06-18 ENCOUNTER — Encounter: Payer: Self-pay | Admitting: Internal Medicine

## 2023-09-23 ENCOUNTER — Other Ambulatory Visit: Payer: Self-pay

## 2023-09-23 MED ORDER — AMLODIPINE BESYLATE 5 MG PO TABS: 5.0000 mg | ORAL_TABLET | Freq: Every day | ORAL | 3 refills | Status: AC

## 2024-03-15 ENCOUNTER — Other Ambulatory Visit: Payer: Self-pay | Admitting: Internal Medicine

## 2024-05-17 ENCOUNTER — Encounter: Payer: Self-pay | Admitting: Internal Medicine

## 2024-05-17 DIAGNOSIS — R739 Hyperglycemia, unspecified: Secondary | ICD-10-CM

## 2024-05-17 DIAGNOSIS — Z0000 Encounter for general adult medical examination without abnormal findings: Secondary | ICD-10-CM

## 2024-05-17 DIAGNOSIS — I10 Essential (primary) hypertension: Secondary | ICD-10-CM

## 2024-06-13 ENCOUNTER — Other Ambulatory Visit

## 2024-06-14 ENCOUNTER — Other Ambulatory Visit

## 2024-06-14 DIAGNOSIS — Z0000 Encounter for general adult medical examination without abnormal findings: Secondary | ICD-10-CM | POA: Diagnosis not present

## 2024-06-14 DIAGNOSIS — I10 Essential (primary) hypertension: Secondary | ICD-10-CM

## 2024-06-14 LAB — CBC WITH DIFFERENTIAL/PLATELET
Basophils Absolute: 0 10*3/uL (ref 0.0–0.1)
Basophils Relative: 0.9 % (ref 0.0–3.0)
Eosinophils Absolute: 0.4 10*3/uL (ref 0.0–0.7)
Eosinophils Relative: 8.7 % — ABNORMAL HIGH (ref 0.0–5.0)
HCT: 44.2 % (ref 39.0–52.0)
Hemoglobin: 14.9 g/dL (ref 13.0–17.0)
Lymphocytes Relative: 30.4 % (ref 12.0–46.0)
Lymphs Abs: 1.6 10*3/uL (ref 0.7–4.0)
MCHC: 33.6 g/dL (ref 30.0–36.0)
MCV: 81.1 fl (ref 78.0–100.0)
Monocytes Absolute: 0.5 10*3/uL (ref 0.1–1.0)
Monocytes Relative: 9.1 % (ref 3.0–12.0)
Neutro Abs: 2.6 10*3/uL (ref 1.4–7.7)
Neutrophils Relative %: 50.9 % (ref 43.0–77.0)
Platelets: 238 10*3/uL (ref 150.0–400.0)
RBC: 5.46 Mil/uL (ref 4.22–5.81)
RDW: 14.7 % (ref 11.5–15.5)
WBC: 5.1 10*3/uL (ref 4.0–10.5)

## 2024-06-14 LAB — COMPREHENSIVE METABOLIC PANEL WITH GFR
ALT: 14 U/L (ref 3–53)
AST: 18 U/L (ref 5–37)
Albumin: 4.3 g/dL (ref 3.5–5.2)
Alkaline Phosphatase: 38 U/L — ABNORMAL LOW (ref 39–117)
BUN: 15 mg/dL (ref 6–23)
CO2: 30 meq/L (ref 19–32)
Calcium: 9.5 mg/dL (ref 8.4–10.5)
Chloride: 104 meq/L (ref 96–112)
Creatinine, Ser: 1.2 mg/dL (ref 0.40–1.50)
GFR: 70.7 mL/min
Glucose, Bld: 88 mg/dL (ref 70–99)
Potassium: 3.9 meq/L (ref 3.5–5.1)
Sodium: 142 meq/L (ref 135–145)
Total Bilirubin: 1.1 mg/dL (ref 0.2–1.2)
Total Protein: 6.9 g/dL (ref 6.0–8.3)

## 2024-06-14 LAB — LIPID PANEL
Cholesterol: 226 mg/dL — ABNORMAL HIGH (ref 28–200)
HDL: 60.4 mg/dL
LDL Cholesterol: 151 mg/dL — ABNORMAL HIGH (ref 10–99)
NonHDL: 165.71
Total CHOL/HDL Ratio: 4
Triglycerides: 72 mg/dL (ref 10.0–149.0)
VLDL: 14.4 mg/dL (ref 0.0–40.0)

## 2024-06-14 LAB — PSA: PSA: 0.86 ng/mL (ref 0.10–4.00)

## 2024-06-16 ENCOUNTER — Encounter: Payer: Self-pay | Admitting: Internal Medicine

## 2024-06-16 ENCOUNTER — Ambulatory Visit: Payer: 59 | Admitting: Internal Medicine

## 2024-06-16 ENCOUNTER — Ambulatory Visit: Payer: Self-pay | Admitting: Internal Medicine

## 2024-06-16 VITALS — BP 126/72 | HR 97 | Temp 97.7°F | Resp 16 | Ht 68.0 in | Wt 200.5 lb

## 2024-06-16 DIAGNOSIS — Z1211 Encounter for screening for malignant neoplasm of colon: Secondary | ICD-10-CM

## 2024-06-16 DIAGNOSIS — Z23 Encounter for immunization: Secondary | ICD-10-CM

## 2024-06-16 DIAGNOSIS — Z0000 Encounter for general adult medical examination without abnormal findings: Secondary | ICD-10-CM
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
## 2024-06-16 NOTE — Patient Instructions (Addendum)
 Please read your instructions carefully.   Go to the front desk for the checkout Please make an appointment for your next physical exam in 1 year  To arrange for colonoscopy call the gastroenterology department: [phone]   Today you got a tetanus shot Other vaccines are recommended: Flu shot COVID-vaccine Shingrix     Check the  blood pressure regularly Blood pressure goal:  between 110/65 and  130/80. If it is consistently higher or lower, let me know     HYPERTENSION: Your blood pressure is well-controlled with your current medication. -Continue your current medication regimen for blood pressure control.  - Your cholesterol levels are slightly elevated, but your cardiovascular risk is low. -Continue with regular exercise and dietary changes to manage your cholesterol.  SLEEP DISTURBANCE:  You are experiencing difficulty with sleep, including waking at night and racing thoughts. -Try taking melatonin 10 mg at bedtime. -Maintain a consistent sleep schedule. -Reduce screen time before bed. -Consider using magnesium as a natural remedy for sleep.

## 2025-06-18 ENCOUNTER — Encounter: Admitting: Internal Medicine
